# Patient Record
Sex: Male | Born: 1970
Health system: Southern US, Community
[De-identification: ages and names within clinical notes are randomized; demographics above are authoritative.]

## PROBLEM LIST (undated history)

## (undated) DIAGNOSIS — K219 Gastro-esophageal reflux disease without esophagitis: Secondary | ICD-10-CM

## (undated) DIAGNOSIS — N201 Calculus of ureter: Secondary | ICD-10-CM

## (undated) DIAGNOSIS — Z87442 Personal history of urinary calculi: Secondary | ICD-10-CM

## (undated) DIAGNOSIS — T7840XA Allergy, unspecified, initial encounter: Secondary | ICD-10-CM

## (undated) HISTORY — DX: Allergy, unspecified, initial encounter: T78.40XA

## (undated) HISTORY — DX: Personal history of urinary calculi: Z87.442

## (undated) HISTORY — DX: Gastro-esophageal reflux disease without esophagitis: K21.9

---

## 2015-01-28 ENCOUNTER — Emergency Department (HOSPITAL_COMMUNITY)
Admission: AD | Admit: 2015-01-28 | Discharge: 2015-01-28 | Disposition: A | Discharge status: Home / Self Care | Payer: 59 | Source: Ambulatory Visit | Attending: Urology | Admitting: Urology

## 2015-01-28 ENCOUNTER — Emergency Department (HOSPITAL_COMMUNITY): Payer: 59

## 2015-01-28 DIAGNOSIS — R109 Unspecified abdominal pain: Secondary | ICD-10-CM

## 2015-01-28 DIAGNOSIS — N132 Hydronephrosis with renal and ureteral calculous obstruction: Secondary | ICD-10-CM | POA: Diagnosis not present

## 2015-01-28 DIAGNOSIS — R1032 Left lower quadrant pain: Secondary | ICD-10-CM | POA: Insufficient documentation

## 2015-01-28 DIAGNOSIS — I10 Essential (primary) hypertension: Secondary | ICD-10-CM | POA: Insufficient documentation

## 2015-01-28 LAB — CBC
HEMATOCRIT: 43.9 % (ref 39.0–52.0)
HEMOGLOBIN: 14.9 g/dL (ref 13.0–17.0)
MCH: 28.8 pg (ref 26.0–34.0)
MCHC: 33.9 g/dL (ref 30.0–36.0)
MCV: 84.7 fL (ref 78.0–100.0)
Platelets: 312 10*3/uL (ref 150–400)
RBC: 5.18 MIL/uL (ref 4.22–5.81)
RDW: 12.7 % (ref 11.5–15.5)
WBC: 6.8 10*3/uL (ref 4.0–10.5)

## 2015-01-28 LAB — URINALYSIS, ROUTINE W REFLEX MICROSCOPIC
Bilirubin Urine: NEGATIVE
Glucose, UA: NEGATIVE mg/dL
Ketones, ur: 15 mg/dL — AB
Nitrite: NEGATIVE
Protein, ur: 30 mg/dL — AB
Specific Gravity, Urine: 1.022 (ref 1.005–1.030)
pH: 5.5 (ref 5.0–8.0)

## 2015-01-28 LAB — COMPREHENSIVE METABOLIC PANEL
ALBUMIN: 4.5 g/dL (ref 3.5–5.0)
ALT: 28 U/L (ref 17–63)
ANION GAP: 9 (ref 5–15)
AST: 23 U/L (ref 15–41)
Alkaline Phosphatase: 65 U/L (ref 38–126)
BUN: 14 mg/dL (ref 6–20)
CHLORIDE: 103 mmol/L (ref 101–111)
CO2: 23 mmol/L (ref 22–32)
Calcium: 9.3 mg/dL (ref 8.9–10.3)
Creatinine, Ser: 0.85 mg/dL (ref 0.61–1.24)
GFR calc Af Amer: >60 mL/min (ref >60)
Glucose, Bld: 99 mg/dL (ref 65–99)
POTASSIUM: 3.3 mmol/L — AB (ref 3.5–5.1)
Sodium: 135 mmol/L (ref 135–145)
Total Bilirubin: 1.1 mg/dL (ref 0.3–1.2)
Total Protein: 7.8 g/dL (ref 6.5–8.1)

## 2015-01-28 LAB — URINE MICROSCOPIC-ADD ON
BACTERIA UA: NONE SEEN
Squamous Epithelial / LPF: NONE SEEN

## 2015-01-28 LAB — LIPASE, BLOOD: LIPASE: 26 U/L (ref 11–51)

## 2015-01-28 MED ORDER — OXYCODONE-ACETAMINOPHEN 5-325 MG PO TABS
1.0000 | ORAL_TABLET | Freq: Once | ORAL | Status: AC
  Administered 2015-01-28: 1 via ORAL
  Filled 2015-01-28: qty 1

## 2015-01-28 MED ORDER — ONDANSETRON 4 MG PO TBDP
4.0000 mg | ORAL_TABLET | Freq: Once | ORAL | Status: AC
  Administered 2015-01-28: 4 mg via ORAL
  Filled 2015-01-28: qty 1

## 2015-01-28 MED ORDER — ONDANSETRON 4 MG PO TBDP: 4.0000 mg | ORAL_TABLET | Freq: Three times a day (TID) | ORAL | Status: DC | PRN

## 2015-01-28 MED ORDER — KETOROLAC TROMETHAMINE 30 MG/ML IJ SOLN
30.0000 mg | Freq: Once | INTRAMUSCULAR | Status: AC
  Administered 2015-01-28: 30 mg via INTRAVENOUS
  Filled 2015-01-28: qty 1

## 2015-01-28 MED ORDER — HYDROCODONE-ACETAMINOPHEN 5-325 MG PO TABS: 1.0000 | ORAL_TABLET | Freq: Four times a day (QID) | ORAL | Status: DC | PRN

## 2015-01-28 MED ORDER — SODIUM CHLORIDE 0.9 % IV BOLUS (SEPSIS)
1000.0000 mL | Freq: Once | INTRAVENOUS | Status: AC
  Administered 2015-01-28: 1000 mL via INTRAVENOUS

## 2015-01-28 NOTE — ED Notes (Signed)
Pt. C/o left flank pain, nausea. with reddish urine since 1400.

## 2015-01-28 NOTE — ED Provider Notes (Signed)
CSN: [id number]     Arrival date & time 01/28/15  1509 History   First MD Initiated Contact with Patient 01/28/15 1613     Chief Complaint  Patient presents with  . Flank Pain    HPI  Danny Zimmerman is an 44 y.o. male with no significant PMH who presents to the ED for evaluation of abdominal pain. He states he was in his usual state of health until ~1:00PM today when he suddenly had a sharp pain in his LLQ. 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 He states it radiates to his back. He took two advil and the pain resolved. He states the pain is back now though less severe and rates it 4/10. Pt is concerned because prior to coming to the ED his urine looked red. Denies dysuria, urinary frequency, urinary urgency. Denies history of kidney stones. Denies fever, chills, N/V. Denies chest pain or SOB.   No past medical history on file. No past surgical history on file. No family history on file. Social History  Substance Use Topics  . Smoking status: Not on file  . Smokeless tobacco: Not on file  . Alcohol Use: Not on file    Review of Systems  All other systems reviewed and are negative.     Allergies  Review of patient's allergies indicates not on file.  Home Medications   Prior to Admission medications   Not on File   BP 158/101 mmHg  Pulse 69  Temp(Src) 98.1 F (36.7 C) (Oral)  Resp 17  Ht 5\' 5"  (1.651 m)  Wt 170 lb (77.111 kg)  BMI 28.29 kg/m2  SpO2 97% Physical Exam  Constitutional: He is oriented to person, place, and time. No distress.  HENT:  Right Ear: External ear normal.  Left Ear: External ear normal.  Nose: Nose normal.  Mouth/Throat: Oropharynx is clear and moist. No oropharyngeal exudate.  Eyes: Conjunctivae and EOM are normal. Pupils are equal, round, and reactive to light.  Neck: Normal range of motion. Neck supple.  Cardiovascular: Normal rate, regular rhythm, normal heart sounds and intact distal  pulses.   No murmur heard. Pulmonary/Chest: Effort normal and breath sounds normal. No respiratory distress. He has no wheezes.  Abdominal: Soft. Bowel sounds are normal. He exhibits no distension. There is no tenderness. There is no rebound, no guarding and no CVA tenderness.  Musculoskeletal: Normal range of motion. He exhibits no edema.  Lymphadenopathy:    He has no cervical adenopathy.  Neurological: He is alert and oriented to person, place, and time. No cranial nerve deficit.  Skin: Skin is warm and dry. He is not diaphoretic.  Psychiatric: He has a normal mood and affect.  Nursing note and vitals reviewed.   ED Course  Procedures (including critical care time) Labs Review Labs Reviewed  URINALYSIS, ROUTINE W REFLEX MICROSCOPIC (NOT AT [facility]) - Abnormal; Notable for the following:    Color, Urine RED (*)    APPearance CLOUDY (*)    Hgb urine dipstick LARGE (*)    Ketones, ur 15 (*)    Protein, ur 30 (*)    Leukocytes, UA SMALL (*)    All other components within normal limits  COMPREHENSIVE METABOLIC PANEL - Abnormal; Notable for the following:    Potassium 3.3 (*)    All other components within normal limits  LIPASE, BLOOD  CBC  URINE MICROSCOPIC-ADD ON    Imaging Review US  Renal  01/28/2015  CLINICAL DATA:  Patient with left flank pain. EXAM: RENAL / URINARY TRACT ULTRASOUND COMPLETE COMPARISON:  None. FINDINGS: Right Kidney: Length: 11.9 cm. Normal renal cortical thickness and echogenicity. No hydronephrosis. Off of the inferior pole there is a 1.8 x 1.3 x 1.4 cm hypoechoic mass. Left Kidney: Length: 12.0 cm. Normal renal cortical thickness and echogenicity. Minimal pelviectasis. Bladder: Appears normal for degree of bladder distention. IMPRESSION: Minimal left-sided pelviectasis. Indeterminate hypoechoic exophytic mass off the inferior pole of the right kidney. This needs dedicated evaluation with either pre and post contrast-enhanced CT or MRI in the non acute setting.  Electronically Signed   By: Lovey Newcomer M.D.   On: 01/28/2015 18:15   I have personally reviewed and evaluated these images and lab results as part of my medical decision-making.   EKG Interpretation None      MDM   Final diagnoses:  Left flank pain    Nonfocal exam. Pt mildly hypertensive but otherwise vital signs unremarkable. Given gross hematuria am concerned about possible kidney stone. UA, lipase, cbc, cmp ordered. Will get renal US.  UA confirms large hgb, 15 ketones, 30 protein, small leuks. US renal shows minimal L side pelviectasis which might be causing pt's pain. Discussed findings of mass on R kidney that will require f/u. Pt and wife verbalized understanding. D/c home with pain meds and zofran with instructions to schedule urology f/u.     Danny Ng, PA-C 01/29/15 0101  Danny Arthurs, MD 01/29/15 [phone]

## 2015-01-28 NOTE — Progress Notes (Signed)
Patient listed as having [place] insurance without a pcp.  EDCM spoke to patient at bedside.  Patient reports he is new to this state and does not have a pcp yet.  [facility] provided patient with list of pcps who accept [employer] insurnace within a ten mile radius of patient's zip code.  Patient thankful for resources.  No further EDCM needs at this time.

## 2015-01-28 NOTE — Discharge Instructions (Signed)
As we discussed, your bloodwork today was normal. You can take the Vicodin (pain medicine) and Zofran (nausea medicine) as needed.  Please follow-up with urology as soon as possible for further evaluation of your ultrasound findings and the blood & protein in your urine. If the pain in your left side does not resolve with urologic treatment please follow-up with your primary care provider. Please also talk to your primary care provider about your blood pressure. It was slightly high in the ER today. You might require blood pressure medicine if it does not go back down to normal. Return to the ER for new or worsening symptoms.   Please obtain all of your results from medical records or have your doctors office obtain the results - share them with your doctor - you should be seen at your doctors office in the next 2 days. Call today to arrange your follow up. Take the medications as prescribed. Please review all of the medicines and only take them if you do not have an allergy to them. Please be aware that if you are taking birth control pills, taking other prescriptions, ESPECIALLY ANTIBIOTICS may make the birth control ineffective - if this is the case, either do not engage in sexual activity or use alternative methods of birth control such as condoms until you have finished the medicine and your family doctor says it is OK to restart them. If you are on a blood thinner such as COUMADIN, be aware that any other medicine that you take may cause the coumadin to either work too much, or not enough - you should have your coumadin level rechecked in next 7 days if this is the case.  ?  It is also a possibility that you have an allergic reaction to any of the medicines that you have been prescribed - Everybody reacts differently to medications and while MOST people have no trouble with most medicines, you may have a reaction such as nausea, vomiting, rash, swelling, shortness of breath. If this is the case, please  stop taking the medicine immediately and contact your physician.  ?  You should return to the ER if you develop severe or worsening symptoms.

## 2015-01-30 ENCOUNTER — Encounter (HOSPITAL_COMMUNITY): Payer: Self-pay | Admitting: Emergency Medicine

## 2015-01-30 ENCOUNTER — Emergency Department (HOSPITAL_COMMUNITY)
Admission: EM | Admit: 2015-01-30 | Discharge: 2015-01-30 | Disposition: A | Discharge status: Home / Self Care | Payer: 59 | Source: Home / Self Care | Attending: Emergency Medicine | Admitting: Emergency Medicine

## 2015-01-30 ENCOUNTER — Emergency Department (HOSPITAL_COMMUNITY): Payer: 59

## 2015-01-30 DIAGNOSIS — N200 Calculus of kidney: Secondary | ICD-10-CM

## 2015-01-30 LAB — BASIC METABOLIC PANEL
Anion gap: 9 (ref 5–15)
BUN: 9 mg/dL (ref 6–20)
CHLORIDE: 103 mmol/L (ref 101–111)
CO2: 23 mmol/L (ref 22–32)
Calcium: 9.2 mg/dL (ref 8.9–10.3)
Creatinine, Ser: 1.44 mg/dL — ABNORMAL HIGH (ref 0.61–1.24)
GFR calc Af Amer: >60 mL/min (ref >60)
GFR calc non Af Amer: 59 mL/min — ABNORMAL LOW (ref >60)
GLUCOSE: 110 mg/dL — AB (ref 65–99)
POTASSIUM: 3.8 mmol/L (ref 3.5–5.1)
Sodium: 135 mmol/L (ref 135–145)

## 2015-01-30 LAB — CBC WITH DIFFERENTIAL/PLATELET
Basophils Absolute: 0 10*3/uL (ref 0.0–0.1)
Basophils Relative: 0 %
EOS PCT: 0 %
Eosinophils Absolute: 0 10*3/uL (ref 0.0–0.7)
HCT: 43.6 % (ref 39.0–52.0)
Hemoglobin: 14.9 g/dL (ref 13.0–17.0)
LYMPHS ABS: 1.5 10*3/uL (ref 0.7–4.0)
LYMPHS PCT: 12 %
MCH: 29.1 pg (ref 26.0–34.0)
MCHC: 34.2 g/dL (ref 30.0–36.0)
MCV: 85.2 fL (ref 78.0–100.0)
MONO ABS: 1.1 10*3/uL — AB (ref 0.1–1.0)
MONOS PCT: 9 %
Neutro Abs: 9.7 10*3/uL — ABNORMAL HIGH (ref 1.7–7.7)
Neutrophils Relative %: 79 %
PLATELETS: 265 10*3/uL (ref 150–400)
RBC: 5.12 MIL/uL (ref 4.22–5.81)
RDW: 12.8 % (ref 11.5–15.5)
WBC: 12.4 10*3/uL — ABNORMAL HIGH (ref 4.0–10.5)

## 2015-01-30 LAB — URINE MICROSCOPIC-ADD ON: BACTERIA UA: NONE SEEN

## 2015-01-30 LAB — URINALYSIS, ROUTINE W REFLEX MICROSCOPIC
Bilirubin Urine: NEGATIVE
GLUCOSE, UA: NEGATIVE mg/dL
Ketones, ur: NEGATIVE mg/dL
Leukocytes, UA: NEGATIVE
Nitrite: NEGATIVE
PROTEIN: NEGATIVE mg/dL
Specific Gravity, Urine: 1.017 (ref 1.005–1.030)
pH: 5.5 (ref 5.0–8.0)

## 2015-01-30 MED ORDER — TAMSULOSIN HCL 0.4 MG PO CAPS: 0.4000 mg | ORAL_CAPSULE | Freq: Every day | ORAL | Status: DC

## 2015-01-30 MED ORDER — SODIUM CHLORIDE 0.9 % IV SOLN
1000.0000 mL | Freq: Once | INTRAVENOUS | Status: AC
  Administered 2015-01-30: 1000 mL via INTRAVENOUS

## 2015-01-30 MED ORDER — HYDROMORPHONE HCL 1 MG/ML IJ SOLN
1.0000 mg | Freq: Once | INTRAMUSCULAR | Status: AC
  Administered 2015-01-30: 1 mg via INTRAVENOUS
  Filled 2015-01-30: qty 1

## 2015-01-30 MED ORDER — SODIUM CHLORIDE 0.9 % IV SOLN: 1000.0000 mL | INTRAVENOUS | Status: DC

## 2015-01-30 MED ORDER — ONDANSETRON HCL 4 MG/2ML IJ SOLN
4.0000 mg | Freq: Once | INTRAMUSCULAR | Status: AC
  Administered 2015-01-30: 4 mg via INTRAVENOUS
  Filled 2015-01-30: qty 2

## 2015-01-30 MED ORDER — OXYCODONE-ACETAMINOPHEN 5-325 MG PO TABS: 1.0000 | ORAL_TABLET | ORAL | Status: DC | PRN

## 2015-01-30 MED ORDER — ONDANSETRON 4 MG PO TBDP: ORAL_TABLET | ORAL | Status: DC

## 2015-01-30 MED ORDER — IBUPROFEN 400 MG PO TABS: 400.0000 mg | ORAL_TABLET | Freq: Four times a day (QID) | ORAL | Status: DC | PRN

## 2015-01-30 MED ORDER — KETOROLAC TROMETHAMINE 30 MG/ML IJ SOLN
30.0000 mg | Freq: Once | INTRAMUSCULAR | Status: AC
  Administered 2015-01-30: 30 mg via INTRAVENOUS
  Filled 2015-01-30: qty 1

## 2015-01-30 MED ORDER — SODIUM CHLORIDE 0.9 % IV SOLN: 1000.0000 mL | Freq: Once | INTRAVENOUS | Status: DC

## 2015-01-30 NOTE — ED Provider Notes (Signed)
CSN: [id number]     Arrival date & time 01/30/15  0720 History   First MD Initiated Contact with Patient 01/30/15 [phone]     Chief Complaint  Patient presents with  . Flank Pain  . Back Pain     (Consider location/radiation/quality/duration/timing/severity/associated sxs/prior Treatment) HPI Patient seen 2 days ago and diagnosed with flank pain and hematuria. At that time ultrasound showed some left hydronephrosis. Patient was discharged with hydrocodone and Zofran. He reports the pain has worsened and he has not been able to sleep for 2 nights. He states that he has taken the pain medication and Zofran but he continues to have nausea and vomiting. He reports some chills but no documented fever. Pain is sharp and severe on the left flank radiating to the left lower abdomen. He denies testicular pain or pain radiating to the legs. He denies any past medical history. Pain is not particularly worsened by any movement or pressure on the abdomen. History reviewed. No pertinent past medical history. History reviewed. No pertinent past surgical history. No family history on file. Social History  Substance Use Topics  . Smoking status: Never Smoker   . Smokeless tobacco: None  . Alcohol Use: Yes     Comment: socially    Review of Systems 10 Systems reviewed and are negative for acute change except as noted in the HPI.    Allergies  Review of patient's allergies indicates no known allergies.  Home Medications   Prior to Admission medications   Medication Sig Start Date End Date Taking? Authorizing Provider  HYDROcodone-acetaminophen (NORCO/VICODIN) 5-325 MG tablet Take 1 tablet by mouth every 6 (six) hours as needed. 01/28/15  Yes Olivia Canter Sam, PA-C  ibuprofen (ADVIL,MOTRIN) 200 MG tablet Take 400 mg by mouth every 6 (six) hours as needed for moderate pain.   Yes Historical Provider, MD  ondansetron (ZOFRAN ODT) 4 MG disintegrating tablet Take 1 tablet (4 mg total) by mouth every 8  (eight) hours as needed for nausea or vomiting. 01/28/15  Yes Olivia Canter Sam, PA-C  ibuprofen (ADVIL,MOTRIN) 400 MG tablet Take 1 tablet (400 mg total) by mouth every 6 (six) hours as needed. 01/30/15   Charlesetta Shanks, MD  ondansetron (ZOFRAN ODT) 4 MG disintegrating tablet 4mg  ODT q4 hours prn nausea/vomit 01/30/15   Charlesetta Shanks, MD  oxyCODONE-acetaminophen (PERCOCET/ROXICET) 5-325 MG tablet Take 1-2 tablets by mouth every 4 (four) hours as needed for severe pain. 01/30/15   Charlesetta Shanks, MD   BP 113/79 mmHg  Pulse 73  Temp(Src) 98.2 F (36.8 C) (Oral)  Resp 16  Ht 5\' 5"  (1.651 m)  Wt 170 lb (77.111 kg)  BMI 28.29 kg/m2  SpO2 98% Physical Exam  Constitutional: He is oriented to person, place, and time. He appears well-developed and well-nourished.  Patient appears uncomfortable but is alert and nontoxic.  HENT:  Head: Normocephalic and atraumatic.  Eyes: EOM are normal. Pupils are equal, round, and reactive to light.  Neck: Neck supple.  Cardiovascular: Normal rate, regular rhythm, normal heart sounds and intact distal pulses.   Pulmonary/Chest: Effort normal and breath sounds normal.  Abdominal: Soft. Bowel sounds are normal. He exhibits no distension. There is no tenderness.  Patient reports the pain is not particularly reproducible to any palpation in the left lower abdomen or flank.  Musculoskeletal: Normal range of motion. He exhibits no edema.  Neurological: He is alert and oriented to person, place, and time. He has normal strength. Coordination normal. GCS eye subscore is 4.  GCS verbal subscore is 5. GCS motor subscore is 6.  Skin: Skin is warm, dry and intact.  Psychiatric: He has a normal mood and affect.    ED Course  Procedures (including critical care time) Labs Review Labs Reviewed  BASIC METABOLIC PANEL - Abnormal; Notable for the following:    Glucose, Bld 110 (*)    Creatinine, Ser 1.44 (*)    GFR calc non Af Amer 59 (*)    All other components within  normal limits  CBC WITH DIFFERENTIAL/PLATELET - Abnormal; Notable for the following:    WBC 12.4 (*)    Neutro Abs 9.7 (*)    Monocytes Absolute 1.1 (*)    All other components within normal limits  URINALYSIS, ROUTINE W REFLEX MICROSCOPIC (NOT AT [facility]) - Abnormal; Notable for the following:    APPearance TURBID (*)    Hgb urine dipstick SMALL (*)    All other components within normal limits  URINE MICROSCOPIC-ADD ON - Abnormal; Notable for the following:    Squamous Epithelial / LPF 0-5 (*)    All other components within normal limits    Imaging Review US Renal  01/28/2015  CLINICAL DATA:  Patient with left flank pain. EXAM: RENAL / URINARY TRACT ULTRASOUND COMPLETE COMPARISON:  None. FINDINGS: Right Kidney: Length: 11.9 cm. Normal renal cortical thickness and echogenicity. No hydronephrosis. Off of the inferior pole there is a 1.8 x 1.3 x 1.4 cm hypoechoic mass. Left Kidney: Length: 12.0 cm. Normal renal cortical thickness and echogenicity. Minimal pelviectasis. Bladder: Appears normal for degree of bladder distention. IMPRESSION: Minimal left-sided pelviectasis. Indeterminate hypoechoic exophytic mass off the inferior pole of the right kidney. This needs dedicated evaluation with either pre and post contrast-enhanced CT or MRI in the non acute setting. Electronically Signed   By: Lovey Newcomer M.D.   On: 01/28/2015 18:15   Ct Renal Stone Study  01/30/2015  CLINICAL DATA:  Right flank pain.  Nausea and vomiting. EXAM: CT ABDOMEN AND PELVIS WITHOUT CONTRAST TECHNIQUE: Multidetector CT imaging of the abdomen and pelvis was performed following the standard protocol without IV contrast. COMPARISON:  Ultrasound dated 01/28/2015 FINDINGS: Lower chest:  Minimal linear atelectasis at the lung bases. Hepatobiliary: Normal. Pancreas: Normal. Spleen: Normal. Adrenals/Urinary Tract: 4 mm stone obstructing the left ureter at the ureterovesical junction with slight left hydronephrosis. Tiny stone in the  lower pole of the left kidney. 4 mm stone in the lower pole of the right kidney and 6 mm stone in the upper pole. 13 mm cyst in the lower pole of the right kidney. Bladder is normal. Stomach/Bowel: Normal. Vascular/Lymphatic: Normal. Reproductive: Normal. Other: No free air or free fluid. Musculoskeletal: Normal. IMPRESSION: 4 mm stone obstructing the distal left ureter. Slight left hydronephrosis. Bilateral renal calculi. Electronically Signed   By: Lorriane Shire M.D.   On: 01/30/2015 09:25   I have personally reviewed and evaluated these images and lab results as part of my medical decision-making.   EKG Interpretation None      MDM   Final diagnoses:  Kidney stone   Patient presents with flank pain. He does have a kidney stone at the [facility]. He has been completely pain controlled in the emergency department. At this time I will change him to Percocet and ibuprofen for pain control. He is instructed to strain urine and follow up with urology. Patient instructed to return if he should have uncontrolled pain or other concerning symptoms.    Charlesetta Shanks, MD 01/30/15 [phone]

## 2015-01-30 NOTE — ED Notes (Signed)
Pt c./o left flank pain and left back pain ongoing since Friday. Pt seen at [facility] and was told he has a kidney stone. Prescribed medications not working for pain or N/V.

## 2015-01-30 NOTE — ED Notes (Signed)
PT IS IN STABLE CONDITION UPON D/C AND AMBULATES FROM ED.

## 2015-01-30 NOTE — Discharge Instructions (Signed)
Kidney Stones °Kidney stones (urolithiasis) are deposits that form inside your kidneys. The intense pain is caused by the stone moving through the urinary tract. When the stone moves, the ureter goes into spasm around the stone. The stone is usually passed in the urine.  °CAUSES  °· A disorder that makes certain neck glands produce too much parathyroid hormone (primary hyperparathyroidism). °· A buildup of uric acid crystals, similar to gout in your joints. °· Narrowing (stricture) of the ureter. °· A kidney obstruction present at birth (congenital obstruction). °· Previous surgery on the kidney or ureters. °· Numerous kidney infections. °SYMPTOMS  °· Feeling sick to your stomach (nauseous). °· Throwing up (vomiting). °· Blood in the urine (hematuria). °· Pain that usually spreads (radiates) to the groin. °· Frequency or urgency of urination. °DIAGNOSIS  °· Taking a history and physical exam. °· Blood or urine tests. °· CT scan. °· Occasionally, an examination of the inside of the urinary bladder (cystoscopy) is performed. °TREATMENT  °· Observation. °· Increasing your fluid intake. °· Extracorporeal shock wave lithotripsy--This is a noninvasive procedure that uses shock waves to break up kidney stones. °· Surgery may be needed if you have severe pain or persistent obstruction. There are various surgical procedures. Most of the procedures are performed with the use of small instruments. Only small incisions are needed to accommodate these instruments, so recovery time is minimized. °The size, location, and chemical composition are all important variables that will determine the proper choice of action for you. Talk to your health care provider to better understand your situation so that you will minimize the risk of injury to yourself and your kidney.  °HOME CARE INSTRUCTIONS  °· Drink enough water and fluids to keep your urine clear or pale yellow. This will help you to pass the stone or stone fragments. °· Strain  all urine through the provided strainer. Keep all particulate matter and stones for your health care provider to see. The stone causing the pain may be as small as a grain of salt. It is very important to use the strainer each and every time you pass your urine. The collection of your stone will allow your health care provider to analyze it and verify that a stone has actually passed. The stone analysis will often identify what you can do to reduce the incidence of recurrences. °· Only take over-the-counter or prescription medicines for pain, discomfort, or fever as directed by your health care provider. °· Keep all follow-up visits as told by your health care provider. This is important. °· Get follow-up X-rays if required. The absence of pain does not always mean that the stone has passed. It may have only stopped moving. If the urine remains completely obstructed, it can cause loss of kidney function or even complete destruction of the kidney. It is your responsibility to make sure X-rays and follow-ups are completed. Ultrasounds of the kidney can show blockages and the status of the kidney. Ultrasounds are not associated with any radiation and can be performed easily in a matter of minutes. °· Make changes to your daily diet as told by your health care provider. You may be told to: °¨ Limit the amount of salt that you eat. °¨ Eat 5 or more servings of fruits and vegetables each day. °¨ Limit the amount of meat, poultry, fish, and eggs that you eat. °· Collect a 24-hour urine sample as told by your health care provider. You may need to collect another urine sample every 6-12   months. °SEEK MEDICAL CARE IF: °· You experience pain that is progressive and unresponsive to any pain medicine you have been prescribed. °SEEK IMMEDIATE MEDICAL CARE IF:  °· Pain cannot be controlled with the prescribed medicine. °· You have a fever or shaking chills. °· The severity or intensity of pain increases over 18 hours and is not  relieved by pain medicine. °· You develop a new onset of abdominal pain. °· You feel faint or pass out. °· You are unable to urinate. °  °This information is not intended to replace advice given to you by your health care provider. Make sure you discuss any questions you have with your health care provider. °  °Document Released: 02/26/2005 Document Revised: 11/17/2014 Document Reviewed: 07/30/2012 °Elsevier Interactive Patient Education ©2016 [employer] ° °

## 2015-02-01 ENCOUNTER — Inpatient Hospital Stay (HOSPITAL_COMMUNITY): Payer: 59 | Admitting: Certified Registered"

## 2015-02-01 ENCOUNTER — Encounter (HOSPITAL_COMMUNITY)
Admission: AD | Disposition: A | Discharge status: Home / Self Care | Payer: Self-pay | Source: Ambulatory Visit | Attending: Urology

## 2015-02-01 ENCOUNTER — Other Ambulatory Visit: Payer: Self-pay | Admitting: Urology

## 2015-02-01 ENCOUNTER — Ambulatory Visit (HOSPITAL_COMMUNITY)
Admission: AD | Admit: 2015-02-01 | Discharge: 2015-02-01 | Disposition: A | Discharge status: Home / Self Care | Payer: 59 | Source: Ambulatory Visit | Attending: Urology | Admitting: Urology

## 2015-02-01 ENCOUNTER — Encounter (HOSPITAL_COMMUNITY): Payer: Self-pay | Admitting: *Deleted

## 2015-02-01 DIAGNOSIS — N132 Hydronephrosis with renal and ureteral calculous obstruction: Secondary | ICD-10-CM | POA: Insufficient documentation

## 2015-02-01 HISTORY — DX: Calculus of ureter: N20.1

## 2015-02-01 HISTORY — PX: CYSTOSCOPY/RETROGRADE/URETEROSCOPY/STONE EXTRACTION WITH BASKET: SHX5317

## 2015-02-01 SURGERY — CYSTOSCOPY, WITH CALCULUS REMOVAL USING BASKET
Anesthesia: General | Laterality: Left

## 2015-02-01 MED ORDER — ONDANSETRON HCL 4 MG/2ML IJ SOLN: 4.0000 mg | Freq: Once | INTRAMUSCULAR | Status: DC | PRN

## 2015-02-01 MED ORDER — IOHEXOL 300 MG/ML  SOLN
INTRAMUSCULAR | Status: DC | PRN
  Administered 2015-02-01: 50 mL via INTRAVENOUS

## 2015-02-01 MED ORDER — ONDANSETRON HCL 4 MG/2ML IJ SOLN
INTRAMUSCULAR | Status: DC | PRN
  Administered 2015-02-01: 4 mg via INTRAVENOUS

## 2015-02-01 MED ORDER — HYDROMORPHONE HCL 1 MG/ML IJ SOLN: 0.2500 mg | INTRAMUSCULAR | Status: DC | PRN

## 2015-02-01 MED ORDER — BELLADONNA ALKALOIDS-OPIUM 16.2-60 MG RE SUPP
RECTAL | Status: AC
  Filled 2015-02-01: qty 1

## 2015-02-01 MED ORDER — FENTANYL CITRATE (PF) 100 MCG/2ML IJ SOLN
INTRAMUSCULAR | Status: AC
  Filled 2015-02-01: qty 2

## 2015-02-01 MED ORDER — HYDROCODONE-ACETAMINOPHEN 5-325 MG PO TABS: 1.0000 | ORAL_TABLET | Freq: Four times a day (QID) | ORAL | Status: DC | PRN

## 2015-02-01 MED ORDER — CEFAZOLIN SODIUM-DEXTROSE 2-3 GM-% IV SOLR
2.0000 g | Freq: Once | INTRAVENOUS | Status: AC
  Administered 2015-02-01: 2 g via INTRAVENOUS

## 2015-02-01 MED ORDER — CIPROFLOXACIN HCL 500 MG PO TABS: 500.0000 mg | ORAL_TABLET | Freq: Two times a day (BID) | ORAL | Status: DC

## 2015-02-01 MED ORDER — DEXAMETHASONE SODIUM PHOSPHATE 10 MG/ML IJ SOLN
INTRAMUSCULAR | Status: DC | PRN
  Administered 2015-02-01: 10 mg via INTRAVENOUS

## 2015-02-01 MED ORDER — ONDANSETRON HCL 4 MG/2ML IJ SOLN
INTRAMUSCULAR | Status: AC
Start: 2015-02-01 — End: 2015-02-01
  Filled 2015-02-01: qty 2

## 2015-02-01 MED ORDER — MEPERIDINE HCL 50 MG/ML IJ SOLN: 6.2500 mg | INTRAMUSCULAR | Status: DC | PRN

## 2015-02-01 MED ORDER — SODIUM CHLORIDE 0.9 % IR SOLN
Status: DC | PRN
  Administered 2015-02-01: 3000 mL

## 2015-02-01 MED ORDER — FENTANYL CITRATE (PF) 100 MCG/2ML IJ SOLN
50.0000 ug | INTRAMUSCULAR | Status: DC | PRN
  Administered 2015-02-01 (×2): 50 ug via INTRAVENOUS

## 2015-02-01 MED ORDER — FENTANYL CITRATE (PF) 100 MCG/2ML IJ SOLN
INTRAMUSCULAR | Status: DC | PRN
  Administered 2015-02-01 (×2): 50 ug via INTRAVENOUS

## 2015-02-01 MED ORDER — LACTATED RINGERS IV SOLN
INTRAVENOUS | Status: DC
  Administered 2015-02-01: 1000 mL via INTRAVENOUS

## 2015-02-01 MED ORDER — SUCCINYLCHOLINE CHLORIDE 20 MG/ML IJ SOLN
INTRAMUSCULAR | Status: DC | PRN
  Administered 2015-02-01: 100 mg via INTRAVENOUS

## 2015-02-01 MED ORDER — MIDAZOLAM HCL 2 MG/2ML IJ SOLN
INTRAMUSCULAR | Status: AC
  Filled 2015-02-01: qty 2

## 2015-02-01 MED ORDER — PROPOFOL 10 MG/ML IV BOLUS
INTRAVENOUS | Status: DC | PRN
  Administered 2015-02-01: 200 mg via INTRAVENOUS

## 2015-02-01 MED ORDER — MIDAZOLAM HCL 5 MG/5ML IJ SOLN
INTRAMUSCULAR | Status: DC | PRN
  Administered 2015-02-01: 2 mg via INTRAVENOUS

## 2015-02-01 MED ORDER — LIDOCAINE HCL (PF) 2 % IJ SOLN
INTRAMUSCULAR | Status: DC | PRN
  Administered 2015-02-01: 30 mg via INTRADERMAL

## 2015-02-01 MED ORDER — PROPOFOL 10 MG/ML IV BOLUS
INTRAVENOUS | Status: AC
  Filled 2015-02-01: qty 20

## 2015-02-01 MED ORDER — SODIUM CHLORIDE 0.9 % IR SOLN
Status: DC | PRN
  Administered 2015-02-01: 1000 mL

## 2015-02-01 MED ORDER — CEFAZOLIN SODIUM-DEXTROSE 2-3 GM-% IV SOLR
INTRAVENOUS | Status: AC
  Filled 2015-02-01: qty 50

## 2015-02-01 MED ORDER — BELLADONNA ALKALOIDS-OPIUM 16.2-60 MG RE SUPP
RECTAL | Status: DC | PRN
  Administered 2015-02-01: 1 via RECTAL

## 2015-02-01 MED ORDER — DEXAMETHASONE SODIUM PHOSPHATE 10 MG/ML IJ SOLN
INTRAMUSCULAR | Status: AC
  Filled 2015-02-01: qty 1

## 2015-02-01 MED ORDER — LIDOCAINE HCL (CARDIAC) 20 MG/ML IV SOLN
INTRAVENOUS | Status: AC
  Filled 2015-02-01: qty 5

## 2015-02-01 SURGICAL SUPPLY — 21 items
BAG URO CATCHER STRL LF (DRAPE) ×2 IMPLANT
BASKET STNLS GEMINI 4WIRE 3FR (BASKET) IMPLANT
BASKET ZERO TIP NITINOL 2.4FR (BASKET) ×2 IMPLANT
CATH INTERMIT  6FR 70CM (CATHETERS) ×2 IMPLANT
CLOTH BEACON ORANGE TIMEOUT ST (SAFETY) ×2 IMPLANT
FIBER LASER FLEXIVA 1000 (UROLOGICAL SUPPLIES) IMPLANT
FIBER LASER FLEXIVA 200 (UROLOGICAL SUPPLIES) IMPLANT
FIBER LASER FLEXIVA 365 (UROLOGICAL SUPPLIES) ×2 IMPLANT
FIBER LASER FLEXIVA 550 (UROLOGICAL SUPPLIES) IMPLANT
FIBER LASER TRAC TIP (UROLOGICAL SUPPLIES) ×2 IMPLANT
GLOVE BIOGEL M STRL SZ7.5 (GLOVE) ×2 IMPLANT
GOWN STRL REUS W/TWL XL LVL3 (GOWN DISPOSABLE) ×2 IMPLANT
GUIDEWIRE STR DUAL SENSOR (WIRE) ×2 IMPLANT
IV NS 1000ML (IV SOLUTION) ×1
IV NS 1000ML BAXH (IV SOLUTION) ×1 IMPLANT
KIT BALLN UROMAX 15FX4 (MISCELLANEOUS) ×1 IMPLANT
KIT BALLN UROMAX 26 75X4 (MISCELLANEOUS) ×1
MANIFOLD NEPTUNE II (INSTRUMENTS) ×2 IMPLANT
PACK CYSTO (CUSTOM PROCEDURE TRAY) ×2 IMPLANT
STENT URET 6FRX24 CONTOUR (STENTS) ×2 IMPLANT
TUBING CONNECTING 10 (TUBING) ×2 IMPLANT

## 2015-02-01 NOTE — Anesthesia Postprocedure Evaluation (Signed)
Anesthesia Post Note  Patient: [facility]  Procedure(s) Performed: Procedure(s) (LRB): CYSTOSCOPY/LEFT RETROGRADE/LEFT URETERAL BALLON DILITATION/LEFT /URETEROSCOPY/STONE EXTRACTION WITH BASKET LEFT URETERAL STENT (Left)  Patient location during evaluation: PACU Anesthesia Type: General Level of consciousness: awake and alert Pain management: pain level controlled Vital Signs Assessment: post-procedure vital signs reviewed and stable Respiratory status: spontaneous breathing, nonlabored ventilation, respiratory function stable and patient connected to nasal cannula oxygen Cardiovascular status: blood pressure returned to baseline and stable Postop Assessment: No signs of nausea or vomiting Anesthetic complications: no    Last Vitals:  Filed Vitals:   02/01/15 1845 02/01/15 1915  BP: 134/86 138/101  Pulse: 73 66  Temp: 36.9 C   Resp: 17 19    Last Pain:  Filed Vitals:   02/01/15 1918  PainSc: 0-No pain                 Nyazia Canevari DAVID

## 2015-02-01 NOTE — Transfer of Care (Signed)
Immediate Anesthesia Transfer of Care Note  Patient: [facility]  Procedure(s) Performed: Procedure(s): CYSTOSCOPY/LEFT RETROGRADE/LEFT URETERAL BALLON DILITATION/LEFT /URETEROSCOPY/STONE EXTRACTION WITH BASKET LEFT URETERAL STENT (Left)  Patient Location: PACU  Anesthesia Type:General  Level of Consciousness:  sedated, patient cooperative and responds to stimulation  Airway & Oxygen Therapy:Patient Spontanous Breathing and Patient connected to face mask oxgen  Post-op Assessment:  Report given to PACU RN and Post -op Vital signs reviewed and stable  Post vital signs:  Reviewed and stable  Last Vitals:  Filed Vitals:   02/01/15 1500 02/01/15 1830  BP: 121/74   Pulse: 77   Temp: 36.6 C 36.9 C  Resp: 18     Complications: No apparent anesthesia complications

## 2015-02-01 NOTE — Discharge Instructions (Signed)
You have a ureteral stent in place. There is a small string attached to this that is coming out of the tube that you urinate out of. You will pull the string with the stent out of your urethra in 3 days. Do not pull out the string before 3 days. Do this first thing in the morning. It should not hurt badly but may feel strange. You may see blood in your urine afterwards and that is normal as long as it doesn't look thick like ketchup, have large blood clots or prevent you from urinating.  Call [facility] tomorrow morning and schedule a follow up appointment with one of the Nurse Practitioners in 2 weeks and follow up with Dr. Gaynelle Arabian in 6 weeks.  [facility] at the numbers below for fever >101.39F by mouth, uncontrolled nausea or vomiting, pain uncontrolled by medication, decreased urine output, signs of wound infection (rapidly spreading redness/swelling, purulent discharge, increasing bleeding from wounds, or separation of wound); also call for any new or concerning symptoms.  You may shower and bath as normal  Do not drive while taking narcotic pain medications. Take Colace and/or Senna while taking narcotic pain medication. You may take over-the-counter Laxatives such as Miralax, Senna, or Milk of Magnesia. Stop taking these medications if you develop diarrhea.  Take all medications as prescribed.  Ureteral Stent Instructions: You have been discharged home with a ureteral stent in place. This is a temporary tube placed in the tube (aka ureter) that drains from the kidney to the bladder. This stent is TEMPORARY and it must be removed or exchanged within three months or else significant damage can occur.  Stents cause most patients to have side effects which may include urinary urgency, frequency, and/or pain with voiding. In a male, they can cause erection problems. They may also cause discomfort with strenuous activity. You will likely receive several medicines to help with  these symptoms, for example, oxybutynin (ditropan) or tamsulosin (flomax). Oxybutynin helps quiet the bladder, but it may cause constipation or dry mouth. Please use over-the-counter laxatives or stool softeners to help with the former, and sucking on a sugar-free lozenge can help with dry mouth. Tamsulosin is usually taken at night before going to bed, and it helps with stent discomfort. You may also be prescribed a urinary analgesic (e.g. pyridium, prosed, uribel) to help with pain with urination that can change the color of your urine. Please do not be alarmed by this color change.  You may see some blood in your urine, but this is usually normal. If you have persistent bleeding that is not improving and/or large amounts of clots in your urine and the inability to urinate, please contact Urology to discuss your symptoms.

## 2015-02-01 NOTE — Op Note (Signed)
Preoperative Diagnosis:         left ureteral stone   Postoperative Diagnosis:         left ureteral stone   Procedure(s) Performed:         1. Cystourethroscopy        2. Left ureteroscopy with laser lithotripsy and basket stone extraction       3. Left retrograde pyelograms       4. Left ureteral stent placement       5. Left ureteral balloon dilation       6. Intraoperative fluoroscopy with interpretation less than 1 hour   Teaching Surgeon:   Carolan Clines, M.D.     Resident Surgeon:    E. Harvel Ricks, M.D.   Assistants:         None.   Anesthesia:         General via endotracheal tube.     Specimens:          Renal stone for chemical analysis.     Drains:             93F x 24 double-J ureteral stent with string   Cultures:           None   Estimated Blood Loss: Minimal   IV Fluids:          See Anesthesia record   Complications:              None.   Indications for Surgery:    Danny Zimmerman  is a 44 year old male with a history of nephrolithiasis.  The patient was evaluated and noted with a 4 mm left ureteral stone.  The patient presents today for ureteroscopic treatment of left ureteral stone. The risks and benefits of the procedure were discussed with the patient who wishes to proceed.   Operative Findings:         Normal cystourethroscopy.   All significantly sized stones successfully removed and sent for analysis.  Successful placement of ureteral stent.  Radiologic interpretation of Retrograde pyelogram:  Left kidney demonstrated filling defect in the distal ureter and mild hydroureteronephrosis.  Procedure:          The patient was correctly identified in the preoperative holding area where written informed consent as well potential risks and complications were reviewed. The patient was brought back to the operative suite where a preinduction timeout was performed. After correct information was verified, general anesthesia was induced via endotracheal  tube.  The patient was then gently placed in dorsal lithotomy position.  Sequential compression devices were placed for VTE prophylaxis. The patient was prepped and draped in the usual sterile fashion and appropriate periprocedural antibiotics were administered. A second timeout was performed.   We began by inserting a 22 French rigid cystoscope per urethra with copious lubrication and normal saline irrigation running. The bladder and urethra appeared grossly normal.  We proceeded to cannulate the left ureteral orifice with a combination of a sensor wire and open-ended catheter and performed a retrograde pyelogram after wire removal.  This demonstrated a small filling defect consistent with the known left ureteral stone and mild hydroureteronephrosis.  We then replaced our wire into the renal pelvis under fluoroscopic guidance. We then obtained a semi-rigid ureteroscope and introduced this into the bladder, however, the ureteral orifice was too tight to accommodate the ureteroscope. We then obtained a 4 cm balloon dilator and dilated the ureteral orifice. We then removed the balloon dilator and again passed the semi-rigid  ureteroscope alongside the wire into the distal ureter where we encountered the stone with significant edema and some evidence of impaction.   Using a combination of laser lithotripsy and basket extraction, all visualized stones were removed and the patient was felt to be stone-free at the end of the procedure.  We emptied the patient's bladder, leaving our sensor wire in place.   At this point, we proceeded with ureteral stent placement.  We initially placed a 6 x 24 double-J ureteral stent with the assistance of a pusher over our sensor wire with a satisfactory stent curl proximally and distally within the bladder. We then refilled and emptied the patient's bladder to ensure all debris was removed and left the bladder empty and removed all instrumentation. Again, the stent strings were  trimmed to a short dangle and the patient was awoken from anesthesia and taken to the recovery area.   Post-Op Plan/Instructions:         1) Discharge home when meets PACU criteria and voids x 1;  2) patient to remove ureteral stent by pulling black strings in 3 days at home or may call clinic and have one of clinic nurses do it as well;  3) return to clinic in 2 weeks to see an NP and 6 weeks to see Dr. Gaynelle Arabian.  Attestation: Dr. Gaynelle Arabian was present and scrubbed for the entirety of the procedure.

## 2015-02-01 NOTE — Anesthesia Preprocedure Evaluation (Signed)
Anesthesia Evaluation  Patient identified by MRN, date of birth, ID band Patient awake    Reviewed: Allergy & Precautions, NPO status , Patient's Chart, lab work & pertinent test results  Airway Mallampati: I  TM Distance: >3 FB Neck ROM: Full    Dental   Pulmonary    Pulmonary exam normal        Cardiovascular Normal cardiovascular exam     Neuro/Psych    GI/Hepatic   Endo/Other    Renal/GU      Musculoskeletal   Abdominal   Peds  Hematology   Anesthesia Other Findings   Reproductive/Obstetrics                             Anesthesia Physical Anesthesia Plan  ASA: II  Anesthesia Plan: General   Post-op Pain Management:    Induction: Intravenous  Airway Management Planned: Oral ETT  Additional Equipment:   Intra-op Plan:   Post-operative Plan: Extubation in OR  Informed Consent: I have reviewed the patients History and Physical, chart, labs and discussed the procedure including the risks, benefits and alternatives for the proposed anesthesia with the patient or authorized representative who has indicated his/her understanding and acceptance.     Plan Discussed with: CRNA and Surgeon  Anesthesia Plan Comments:         Anesthesia Quick Evaluation  

## 2015-02-01 NOTE — Anesthesia Procedure Notes (Signed)
Procedure Name: Intubation Date/Time: 02/01/2015 5:44 PM Performed by: Lajuana Carry E Pre-anesthesia Checklist: Patient identified, Emergency Drugs available, Suction available and Patient being monitored Patient Re-evaluated:Patient Re-evaluated prior to inductionOxygen Delivery Method: Circle System Utilized Preoxygenation: Pre-oxygenation with 100% oxygen Intubation Type: IV induction Ventilation: Mask ventilation without difficulty Laryngoscope Size: Mac and 3 Grade View: Grade II Tube type: Oral Tube size: 7.5 mm Number of attempts: 2 (first attempt by CRNA w/ Sabra Heck 2, limited view, atraumatic w/ MAC 3 by Dr. Conrad ) Airway Equipment and Method: Stylet Placement Confirmation: ETT inserted through vocal cords under direct vision,  positive ETCO2 and breath sounds checked- equal and bilateral Secured at: 21 cm Tube secured with: Tape Dental Injury: Teeth and Oropharynx as per pre-operative assessment

## 2015-02-01 NOTE — H&P (Signed)
Reason For Visit Kidney stone   Active Problems Problems  1. Calculus of left ureter (N20.1) 2. Renal colic on left side (Q000111Q)  History of Present Illness     44 yo male referred by the ER for further management of a kidney stone. He was seen in the ER on Friday11/18/16, with L flank pain, nausea, and vomiting, for several hours. However, he had no x-rays and was given Vicodin/nausea medication, and discharged. He returned to [facility] on Sunday, 01/30/15 for recurrent Lt renal colic with L flank pain, LLQ abdominal pain, nausea, vomiting, and gross hematuria. CT showed a 4mm obstructing Lt distal ureteral stone, and multiple stones in both kidneys.    No family hx of stones. No medical illnesses. No sodas. Regular diet. Fast foods: minimal.    Today, pain is controlled with pain med ( Percocet), but pain returns when med wears off. Also Flomax. Motrin 400mg  q 6h. Urine strainer. given in [facility] ED.   Past Medical History Problems  1. History of No significant past medical history  Surgical History Problems  1. History of No Surgical Problems  Current Meds 1. Motrin TABS;  Therapy: (Recorded:22Nov2016) to Recorded 2. Percocet 5-325 MG Oral Tablet;  Therapy: (Recorded:22Nov2016) to Recorded 3. Tamsulosin HCl - 0.4 MG Oral Capsule;  Therapy: (Recorded:22Nov2016) to Recorded  Allergies Medication  1. No Known Drug Allergies  Family History Problems  1. No pertinent family history  Social History Problems    Caffeine use (F15.90)   Married   Never a smoker   No alcohol use   Number of children   Occupation  Review of Systems Genitourinary, constitutional, skin, eye, otolaryngeal, hematologic/lymphatic, cardiovascular, pulmonary, endocrine, musculoskeletal, gastrointestinal, neurological and psychiatric system(s) were reviewed and pertinent findings if present are noted and are otherwise negative.  Genitourinary: urinary frequency, nocturia and hematuria.   Gastrointestinal: nausea, vomiting, flank pain, abdominal pain and constipation.  Musculoskeletal: back pain.    Vitals Vital Signs [Data Includes: Last 1 Day]  Recorded: 22Nov2016 09:02AM  Height: 5 ft 5 in Weight: 178 lb  BMI Calculated: 29.62 BSA Calculated: 1.88 Blood Pressure: 129 / 89 Temperature: 97.2 F Heart Rate: 67  Physical Exam Constitutional: Well nourished and well developed . No acute distress.  ENT:. The ears and nose are normal in appearance.  Neck: The appearance of the neck is normal and no neck mass is present.  Pulmonary: No respiratory distress and normal respiratory rhythm and effort.  Cardiovascular: Heart rate and rhythm are normal . No peripheral edema.  Abdomen: The abdomen is soft and nontender. No masses are palpated. No CVA tenderness. No hernias are palpable. No hepatosplenomegaly noted.  Genitourinary: Examination of the penis demonstrates no discharge, no masses, no lesions and a normal meatus. The penis is uncircumcised. The scrotum is normal in appearance and without lesions. The right epididymis is palpably normal and non-tender. The left epididymis is palpably normal and non-tender. The right testis is non-tender and without masses. The left testis is non-tender and without masses.  Lymphatics: The femoral and inguinal nodes are not enlarged or tender.  Skin: Normal skin turgor, no visible rash and no visible skin lesions.  Neuro/Psych:. Mood and affect are appropriate.    Results/Data Urine [Data Includes: Last 1 Day]   [id number]  COLOR YELLOW   APPEARANCE CLEAR   SPECIFIC GRAVITY 1.025   pH 5.5   GLUCOSE NEGATIVE   BILIRUBIN NEGATIVE   KETONE NEGATIVE   BLOOD 1+   PROTEIN NEGATIVE  NITRITE NEGATIVE   LEUKOCYTE ESTERASE NEGATIVE   SQUAMOUS EPITHELIAL/HPF NONE SEEN HPF  WBC 0-5 WBC/HPF  RBC 0-2 RBC/HPF  BACTERIA NONE SEEN HPF  CRYSTALS NONE SEEN HPF  CASTS NONE SEEN LPF  Yeast NONE SEEN HPF   CT review from [facility]: films show 31mm L  u-v junction stone with L hydronephrosius. In addition, pt has Right upper renal pelvic stone, 64mm, and a RLP calyx stone, 43mm.   Assessment Assessed  1. Renal colic on left side (Q000111Q) 2. Calculus of left ureter (N20.1)  1. Continued L renal colic from L u-v junction stone, ith n/v despite meds. Better with Toradol today. He will need basket extraction tonight.  2. In addition, He will need litholink post-op ( bilateral stones). He may need c++ labs also post op for w/u.   3. Very upset wit care from [facility] on Friday night. will need further evaluation.   Plan Calculus of left ureter, Renal colic on left side  1. Administered: Ketorolac Tromethamine 60 MG/2ML Injection Solution 2. Follow-up Schedule Surgery Office  Follow-up  Status: Hold For - Appointment   Requested for: [phone]  stone extraction tonight.   Signatures Electronically signed by : Carolan Clines, M.D.; Feb 01 2015 10:24AM EST

## 2015-02-01 NOTE — Interval H&P Note (Signed)
History and Physical Interval Note:  02/01/2015 5:15 PM  [facility] [place]  has presented today for surgery, with the diagnosis of left ureteral stone  The various methods of treatment have been discussed with the patient and family. After consideration of risks, benefits and other options for treatment, the patient has consented to  Procedure(s): CYSTOSCOPY/RETROGRADE/URETEROSCOPY/STONE EXTRACTION WITH BASKET (Left) as a surgical intervention .  The patient's history has been reviewed, patient examined, no change in status, stable for surgery.  I have reviewed the patient's chart and labs.  Questions were answered to the patient's satisfaction.    L hand and L flank marked. t  Kaitland Lewellyn I Mady Oubre

## 2015-02-02 ENCOUNTER — Encounter (HOSPITAL_COMMUNITY): Payer: Self-pay | Admitting: Urology

## 2015-02-09 NOTE — Discharge Summary (Signed)
Physician Discharge Summary  Patient ID: Danny Zimmerman MRN: [id number] DOB/AGE: Aug 05, 1970 44 y.o.  Admit date: 02/01/2015 Discharge date: 02/09/2015  Admission Diagnoses: Left ureteral stone  Discharge Diagnoses: Same Active Problems:   * No active hospital problems. *   Discharged Condition: good  Hospital Course:  He was admitted from the ER and taken directly to the OR for left ureteroscopic stone extraction with laser lithotripsy and left ureteral stent placement. He did well and after he recovered from anesthesia he was discharged home in excellent condition.  Consults: None  Significant Diagnostic Studies: none  Treatments: surgery: Left ureteroscopic stone extraction with laser lithotripsy and left ureteral stent placement.  Discharge Exam: Blood pressure 132/82, pulse 68, temperature 98.6 F (37 C), temperature source Oral, resp. rate 17, height 5\' 5"  (1.651 m), weight 79.493 kg (175 lb 4 oz), SpO2 95 %. General appearance: alert, cooperative and appears stated age Head: Normocephalic, without obvious abnormality, atraumatic Resp: clear to auscultation bilaterally GI: soft, non-tender; bowel sounds normal; no masses,  no organomegaly Male genitalia: normal Extremities: extremities normal, atraumatic, no cyanosis or edema  Disposition: 01-Home or Self Care     Medication List    STOP taking these medications        oxyCODONE-acetaminophen 5-325 MG tablet  Commonly known as:  PERCOCET/ROXICET      TAKE these medications        ciprofloxacin 500 MG tablet  Commonly known as:  CIPRO  Take 1 tablet (500 mg total) by mouth 2 (two) times daily.     HYDROcodone-acetaminophen 5-325 MG tablet  Commonly known as:  NORCO  Take 1 tablet by mouth every 6 (six) hours as needed for moderate pain.     ibuprofen 400 MG tablet  Commonly known as:  ADVIL,MOTRIN  Take 1 tablet (400 mg total) by mouth every 6 (six) hours as needed.     ketorolac 30 MG/ML injection   Commonly known as:  TORADOL  Inject 30 mg into the vein once.     ondansetron 4 MG disintegrating tablet  Commonly known as:  ZOFRAN ODT  Take 1 tablet (4 mg total) by mouth every 8 (eight) hours as needed for nausea or vomiting.     STOOL SOFTENER PO  Take 1 capsule by mouth once.     tamsulosin 0.4 MG Caps capsule  Commonly known as:  FLOMAX  Take 1 capsule (0.4 mg total) by mouth daily.           Follow-up Information    Schedule an appointment as soon as possible for a visit with Ailene Rud, MD.   Specialty:  Urology   Why:  Follow up with Nurse Practitioner at [facility] in 2 weeks and follow up to see Dr. Gaynelle Arabian in 6 weeks. Please call the clinic to make these appointments.   Contact information:   [place] [address] [phone]       Signed: Acie Fredrickson 02/09/2015, 1:50 PM

## 2016-01-21 ENCOUNTER — Emergency Department (HOSPITAL_COMMUNITY): Payer: 59

## 2016-01-21 ENCOUNTER — Emergency Department (HOSPITAL_COMMUNITY)
Admission: EM | Admit: 2016-01-21 | Discharge: 2016-01-21 | Disposition: A | Discharge status: Home / Self Care | Payer: 59 | Attending: Emergency Medicine | Admitting: Emergency Medicine

## 2016-01-21 ENCOUNTER — Encounter (HOSPITAL_COMMUNITY): Payer: Self-pay | Admitting: Emergency Medicine

## 2016-01-21 DIAGNOSIS — R0602 Shortness of breath: Secondary | ICD-10-CM | POA: Diagnosis present

## 2016-01-21 LAB — CBC WITH DIFFERENTIAL/PLATELET
BASOS ABS: 0 10*3/uL (ref 0.0–0.1)
Basophils Relative: 0 %
Eosinophils Absolute: 0.1 10*3/uL (ref 0.0–0.7)
Eosinophils Relative: 1 %
HEMATOCRIT: 44.4 % (ref 39.0–52.0)
Hemoglobin: 15.3 g/dL (ref 13.0–17.0)
LYMPHS ABS: 2.9 10*3/uL (ref 0.7–4.0)
LYMPHS PCT: 42 %
MCH: 28.9 pg (ref 26.0–34.0)
MCHC: 34.5 g/dL (ref 30.0–36.0)
MCV: 83.8 fL (ref 78.0–100.0)
MONO ABS: 0.4 10*3/uL (ref 0.1–1.0)
Monocytes Relative: 6 %
NEUTROS ABS: 3.6 10*3/uL (ref 1.7–7.7)
Neutrophils Relative %: 51 %
Platelets: 320 10*3/uL (ref 150–400)
RBC: 5.3 MIL/uL (ref 4.22–5.81)
RDW: 12.5 % (ref 11.5–15.5)
WBC: 7 10*3/uL (ref 4.0–10.5)

## 2016-01-21 LAB — BASIC METABOLIC PANEL
ANION GAP: 7 (ref 5–15)
BUN: 10 mg/dL (ref 6–20)
CHLORIDE: 104 mmol/L (ref 101–111)
CO2: 26 mmol/L (ref 22–32)
Calcium: 9.2 mg/dL (ref 8.9–10.3)
Creatinine, Ser: 0.96 mg/dL (ref 0.61–1.24)
GFR calc Af Amer: >60 mL/min (ref >60)
GLUCOSE: 128 mg/dL — AB (ref 65–99)
POTASSIUM: 4.3 mmol/L (ref 3.5–5.1)
Sodium: 137 mmol/L (ref 135–145)

## 2016-01-21 LAB — I-STAT TROPONIN, ED: Troponin i, poc: 0 ng/mL (ref 0.00–0.08)

## 2016-01-21 NOTE — Discharge Instructions (Signed)
Follow-up with gastroenterology if your symptoms persist to discuss an endoscopy.  Return to the emergency department in the meantime if symptoms significantly worsen or change.

## 2016-01-21 NOTE — ED Provider Notes (Signed)
Danny Zimmerman Provider Note   CSN: [id number] Arrival date & time: 01/21/16  1610     History   Chief Complaint Chief Complaint  Patient presents with  . Shortness of Breath    HPI Danny Zimmerman is a 45 y.o. male.  Patient is a 45 year old male with no significant past medical history. He presents for evaluation of shortness of breath and difficulty swallowing. This occurs when he eats. He reports several episodes over the past few weeks where he develops difficulty breathing. This lasts for several minutes and usually resolves with drinking water. He denies any throat pain. He denies any history of acid reflux or heartburn.   The history is provided by the patient.  Shortness of Breath  This is a new problem. The average episode lasts 3 weeks. The problem occurs intermittently.The problem has not changed since onset.Associated symptoms include a fever. Pertinent negatives include no cough and no chest pain. He has tried nothing for the symptoms. The treatment provided no relief. Associated medical issues do not include asthma, COPD or pneumonia.    Past Medical History:  Diagnosis Date  . Ureteral stone     There are no active problems to display for this patient.   Past Surgical History:  Procedure Laterality Date  . CYSTOSCOPY/RETROGRADE/URETEROSCOPY/STONE EXTRACTION WITH BASKET Left 02/01/2015   Procedure: CYSTOSCOPY/LEFT RETROGRADE/LEFT URETERAL BALLON DILITATION/LEFT /URETEROSCOPY/STONE EXTRACTION WITH BASKET LEFT URETERAL STENT;  Surgeon: Carolan Clines, MD;  Location: [facility];  Service: Urology;  Laterality: Left;       Home Medications    Prior to Admission medications   Medication Sig Start Date End Date Taking? Authorizing Provider  ciprofloxacin (CIPRO) 500 MG tablet Take 1 tablet (500 mg total) by mouth 2 (two) times daily. 02/01/15   Acie Fredrickson, MD  Docusate Calcium (STOOL SOFTENER PO) Take 1 capsule by mouth once.    Historical Provider,  MD  HYDROcodone-acetaminophen (NORCO) 5-325 MG tablet Take 1 tablet by mouth every 6 (six) hours as needed for moderate pain. 02/01/15   Acie Fredrickson, MD  ibuprofen (ADVIL,MOTRIN) 400 MG tablet Take 1 tablet (400 mg total) by mouth every 6 (six) hours as needed. Patient taking differently: Take 400 mg by mouth every 6 (six) hours as needed for moderate pain.  01/30/15   Charlesetta Shanks, MD  ketorolac (TORADOL) 30 MG/ML injection Inject 30 mg into the vein once.    Historical Provider, MD  ondansetron (ZOFRAN ODT) 4 MG disintegrating tablet Take 1 tablet (4 mg total) by mouth every 8 (eight) hours as needed for nausea or vomiting. 01/28/15   Anne Ng, PA-C  tamsulosin (FLOMAX) 0.4 MG CAPS capsule Take 1 capsule (0.4 mg total) by mouth daily. 01/30/15   Charlesetta Shanks, MD    Family History No family history on file.  Social History Social History  Substance Use Topics  . Smoking status: Never Smoker  . Smokeless tobacco: Not on file  . Alcohol use Yes     Comment: socially     Allergies   Patient has no known allergies.   Review of Systems Review of Systems  Constitutional: Positive for fever.  Respiratory: Positive for shortness of breath. Negative for cough.   Cardiovascular: Negative for chest pain.  All other systems reviewed and are negative.    Physical Exam Updated Vital Signs BP 141/95 (BP Location: Left Arm)   Pulse 78   Temp 97.7 F (36.5 C) (Oral)   Resp 20   SpO2 99%  Physical Exam  Constitutional: He is oriented to person, place, and time. He appears well-developed and well-nourished. No distress.  HENT:  Head: Normocephalic and atraumatic.  Mouth/Throat: Oropharynx is clear and moist.  Neck: Normal range of motion. Neck supple.  Cardiovascular: Normal rate and regular rhythm.  Exam reveals no friction rub.   No murmur heard. Pulmonary/Chest: Effort normal and breath sounds normal. No respiratory distress. He has no wheezes. He has no rales.    Abdominal: Soft. Bowel sounds are normal. He exhibits no distension. There is no tenderness.  Musculoskeletal: Normal range of motion. He exhibits no edema.  Lymphadenopathy:    He has no cervical adenopathy.  Neurological: He is alert and oriented to person, place, and time. Coordination normal.  Skin: Skin is warm and dry. He is not diaphoretic.  Nursing note and vitals reviewed.    ED Treatments / Results  Labs (all labs ordered are listed, but only abnormal results are displayed) Labs Reviewed  BASIC METABOLIC PANEL  CBC WITH DIFFERENTIAL/PLATELET  Randolm Idol, ED    EKG  EKG Interpretation  Date/Time:  Saturday January 21 2016 16:47:55 EST Ventricular Rate:  79 PR Interval:    QRS Duration: 93 QT Interval:  343 QTC Calculation: 394 R Axis:   34 Text Interpretation:  Sinus rhythm Low voltage, precordial leads Confirmed by Akera Snowberger  MD, Endia Moncur ([id number]) on 01/21/2016 4:52:57 PM       Radiology No results found.  Procedures Procedures (including critical care time)  Medications Ordered in ED Medications - No data to display   Initial Impression / Assessment and Plan / ED Course  I have reviewed the triage vital signs and the nursing notes.  Pertinent labs & imaging results that were available during my care of the patient were reviewed by me and considered in my medical decision making (see chart for details).  Clinical Course     Patient presents with complaints of intermittent difficulty breathing that seems to occur with eating. I'm suspicious for an esophageal etiology, whether a stricture or dysmotility. His cardiac workup is unremarkable and I see nothing of concern on his chest x-ray. I feel as though he is appropriate for discharge. If his symptoms persist, he will be given the follow-up information for gastroenterology with whom he can discuss endoscopy.  Final Clinical Impressions(s) / ED Diagnoses   Final diagnoses:  None    New  Prescriptions New Prescriptions   No medications on file     Veryl Speak, MD 01/21/16 1811

## 2016-01-21 NOTE — ED Triage Notes (Signed)
Per pt, states he felt like his throat was closing when he was eating-states he felt SOB-states it has happened before-feels like something get stuck in throat when he eats

## 2016-01-21 NOTE — ED Notes (Signed)
Pt states 4 episodes of feeling like something stuck in throat post eating over course of 3 weeks. Pt lung sounds clear. Pt denies hives, rash, or swelling. With assessment no obstruction to airway noted.

## 2016-01-23 ENCOUNTER — Encounter: Payer: Self-pay | Admitting: Gastroenterology

## 2016-02-06 ENCOUNTER — Ambulatory Visit: Payer: 59 | Admitting: Gastroenterology

## 2016-02-06 ENCOUNTER — Encounter: Payer: Self-pay | Admitting: Gastroenterology

## 2016-02-06 ENCOUNTER — Ambulatory Visit (INDEPENDENT_AMBULATORY_CARE_PROVIDER_SITE_OTHER): Payer: 59 | Admitting: Gastroenterology

## 2016-02-06 VITALS — BP 106/60 | HR 76 | Ht 65.0 in | Wt 178.1 lb

## 2016-02-06 DIAGNOSIS — R0602 Shortness of breath: Secondary | ICD-10-CM | POA: Diagnosis not present

## 2016-02-06 DIAGNOSIS — K219 Gastro-esophageal reflux disease without esophagitis: Secondary | ICD-10-CM

## 2016-02-06 DIAGNOSIS — R0989 Other specified symptoms and signs involving the circulatory and respiratory systems: Secondary | ICD-10-CM

## 2016-02-06 DIAGNOSIS — F458 Other somatoform disorders: Secondary | ICD-10-CM | POA: Diagnosis not present

## 2016-02-06 MED ORDER — OMEPRAZOLE 40 MG PO CPDR: 40.0000 mg | DELAYED_RELEASE_CAPSULE | Freq: Every day | ORAL | 3 refills | Status: DC

## 2016-02-06 NOTE — Progress Notes (Signed)
History of Present Illness: This is a 45 year old male referred by Veryl Speak, MD for the evaluation of reflux, shortness of breath and throat closing. Patient relates episodes of reflux following meals and at night on occasion for several months. His symptoms worsened over the past several weeks and are clearly exacerbated by spicy foods. He has a sensation of his throat closing with difficulty swallowing, regurgitation and shortness of breath. He was evaluated in the ED on 11/11. Labwork was unremarkable except for an elevated glucose of 128. EKG and chest x-ray unremarkable. He has not been treated with any over-the-counter or prescription acid reducing medications. He complains of frequent low back pain. He does not have a PCP. Denies weight loss, abdominal pain, constipation, diarrhea, change in stool caliber, melena, hematochezia, nausea, vomiting, dysphagia, chest pain.  No Known Allergies Outpatient Medications Prior to Visit  Medication Sig Dispense Refill  . ibuprofen (ADVIL,MOTRIN) 400 MG tablet Take 1 tablet (400 mg total) by mouth every 6 (six) hours as needed. (Patient taking differently: Take 400 mg by mouth every 6 (six) hours as needed for moderate pain. ) 15 tablet 0  . ciprofloxacin (CIPRO) 500 MG tablet Take 1 tablet (500 mg total) by mouth 2 (two) times daily. 6 tablet 0  . Docusate Calcium (STOOL SOFTENER PO) Take 1 capsule by mouth once.    Marland Kitchen HYDROcodone-acetaminophen (NORCO) 5-325 MG tablet Take 1 tablet by mouth every 6 (six) hours as needed for moderate pain. 31 tablet 0  . ketorolac (TORADOL) 30 MG/ML injection Inject 30 mg into the vein once.    . ondansetron (ZOFRAN ODT) 4 MG disintegrating tablet Take 1 tablet (4 mg total) by mouth every 8 (eight) hours as needed for nausea or vomiting. 20 tablet 0  . tamsulosin (FLOMAX) 0.4 MG CAPS capsule Take 1 capsule (0.4 mg total) by mouth daily. 30 capsule 0   No facility-administered medications prior to visit.    Past  Medical History:  Diagnosis Date  . Ureteral stone    Past Surgical History:  Procedure Laterality Date  . CYSTOSCOPY/RETROGRADE/URETEROSCOPY/STONE EXTRACTION WITH BASKET Left 02/01/2015   Procedure: CYSTOSCOPY/LEFT RETROGRADE/LEFT URETERAL BALLON DILITATION/LEFT /URETEROSCOPY/STONE EXTRACTION WITH BASKET LEFT URETERAL STENT;  Surgeon: Carolan Clines, MD;  Location: [facility];  Service: Urology;  Laterality: Left;   Social History   Social History  . Marital status: Married    Spouse name: N/A  . Number of children: 3  . Years of education: N/A   Occupational History  . Therapist, nutritional    Social History Main Topics  . Smoking status: Never Smoker  . Smokeless tobacco: Never Used  . Alcohol use Yes     Comment: socially  . Drug use: No  . Sexual activity: Yes    Partners: Female   Other Topics Concern  . None   Social History Narrative  . None   Family History  Problem Relation Age of Onset  . Colon cancer Neg Hx   . Esophageal cancer Neg Hx   . Stomach cancer Neg Hx      Review of Systems: Pertinent positive and negative review of systems were noted in the above HPI section. All other review of systems were otherwise negative.   Physical Exam: General: Well developed, well nourished, no acute distress Head: Normocephalic and atraumatic Eyes:  sclerae anicteric, EOMI Ears: Normal auditory acuity Mouth: No deformity or lesions Neck: Supple, no masses or thyromegaly Lungs: Clear throughout to auscultation Heart: Regular rate and  rhythm; no murmurs, rubs or bruits Abdomen: Soft, non tender and non distended. No masses, hepatosplenomegaly or hernias noted. Normal Bowel sounds Rectal:deferred Musculoskeletal: Symmetrical with no gross deformities  Skin: No lesions on visible extremities Pulses:  Normal pulses noted Extremities: No clubbing, cyanosis, edema or deformities noted Neurological: Alert oriented x 4, grossly nonfocal Cervical Nodes:  No significant  cervical adenopathy Inguinal Nodes: No significant inguinal adenopathy Psychological:  Alert and cooperative. Normal mood and affect  Assessment and Recommendations:  1. GERD with regurgitation and suspected globus sensation. Symptoms exacerbated by spicy foods. Follow all standard antireflux measures. Begin omeprazole 40 mg every morning, every day. Tums when necessary. Return office visit in 6-8. Patient is advised to call my office if his symptoms have not significantly improved over the next 2-3 weeks. If symptoms not adequately controlled proceed with EGD.  2. SOB and low back pain. PCP referral for further evaluation.   cc: Veryl Speak, MD

## 2016-02-06 NOTE — Patient Instructions (Signed)
We have sent the following medications to your pharmacy for you to pick up at your convenience: omeprazole.  Patient advised to avoid spicy, acidic, citrus, chocolate, mints, fruit and fruit juices.  Limit the intake of caffeine, alcohol and Soda.  Don't exercise too soon after eating.  Don't lie down within 3-4 hours of eating.  Elevate the head of your bed.  Please schedule a follow-up visit with Dr. Scarlette Calico or Terri Piedra, FNP to have your shortness of breath and back pain addressed.  Thank you for choosing me and [facility].  Pricilla Riffle. Dagoberto Ligas., MD., Marval Regal

## 2016-03-12 HISTORY — PX: UPPER GASTROINTESTINAL ENDOSCOPY: SHX188

## 2016-03-30 ENCOUNTER — Ambulatory Visit: Payer: 59 | Admitting: Gastroenterology

## 2016-05-09 ENCOUNTER — Ambulatory Visit (INDEPENDENT_AMBULATORY_CARE_PROVIDER_SITE_OTHER): Payer: 59 | Admitting: Gastroenterology

## 2016-05-09 ENCOUNTER — Encounter: Payer: Self-pay | Admitting: Gastroenterology

## 2016-05-09 VITALS — BP 124/68 | HR 72 | Ht 65.0 in | Wt 175.0 lb

## 2016-05-09 DIAGNOSIS — K219 Gastro-esophageal reflux disease without esophagitis: Secondary | ICD-10-CM

## 2016-05-09 NOTE — Patient Instructions (Signed)
Patient advised to avoid spicy, acidic, citrus, chocolate, mints, fruit and fruit juices.  Limit the intake of caffeine, alcohol and Soda.  Don't exercise too soon after eating.  Don't lie down within 3-4 hours of eating.  Elevate the head of your bed.  You can decrease your omeprazole to every other day x 1 week, then decrease to every third day x 1 week, then discontinue.   Follow up with Dr. Fuller Plan as needed.   Thank you for choosing me and [facility].  Pricilla Riffle. Dagoberto Ligas., MD., Marval Regal

## 2016-05-09 NOTE — Progress Notes (Signed)
    History of Present Illness: This is a 46 year old male returning for follow-up of GERD. His reflux symptoms are under very good control on antireflux measures and omeprazole 40 mg daily. He states he went out of town for several days and forgot his Nexium and did not note any breakthrough reflux symptoms however he did develop an acute gastroenteritis with nausea vomiting diarrhea that promptly resolved.  Current Medications, Allergies, Past Medical History, Past Surgical History, Family History and Social History were reviewed in [employer] record.  Physical Exam: General: Well developed, well nourished, no acute distress Head: Normocephalic and atraumatic Eyes:  sclerae anicteric, EOMI Ears: Normal auditory acuity Mouth: No deformity or lesions Lungs: Clear throughout to auscultation Heart: Regular rate and rhythm; no murmurs, rubs or bruits Abdomen: Soft, non tender and non distended. No masses, hepatosplenomegaly or hernias noted. Normal Bowel sounds Musculoskeletal: Symmetrical with no gross deformities  Pulses:  Normal pulses noted Extremities: No clubbing, cyanosis, edema or deformities noted Neurological: Alert oriented x 4, grossly nonfocal Psychological:  Alert and cooperative. Normal mood and affect  Assessment and Recommendations:  1. GERD. Continue antireflux measures long-term. Taper omeprazole 40 mg to every other day for 1 week and then every third day for 1 week and then discontinue. Resume if symptoms recur. His symptoms are frequently bothersome or he needs to remain on medications will proceed with EGD for further evaluation. Return office visit in 6 months if he requires medication use to control GERD otherwise prn.

## 2016-12-12 ENCOUNTER — Ambulatory Visit (INDEPENDENT_AMBULATORY_CARE_PROVIDER_SITE_OTHER): Payer: 59 | Admitting: Physician Assistant

## 2016-12-12 ENCOUNTER — Encounter: Payer: Self-pay | Admitting: Physician Assistant

## 2016-12-12 ENCOUNTER — Encounter (INDEPENDENT_AMBULATORY_CARE_PROVIDER_SITE_OTHER): Payer: Self-pay

## 2016-12-12 ENCOUNTER — Encounter: Payer: Self-pay | Admitting: Gastroenterology

## 2016-12-12 VITALS — BP 120/78 | HR 72 | Ht 65.5 in | Wt 177.2 lb

## 2016-12-12 DIAGNOSIS — R1310 Dysphagia, unspecified: Secondary | ICD-10-CM

## 2016-12-12 DIAGNOSIS — K219 Gastro-esophageal reflux disease without esophagitis: Secondary | ICD-10-CM

## 2016-12-12 MED ORDER — OMEPRAZOLE 40 MG PO CPDR: 40.0000 mg | DELAYED_RELEASE_CAPSULE | Freq: Every day | ORAL | 2 refills | Status: DC

## 2016-12-12 NOTE — Progress Notes (Signed)
Reviewed and agree with management plan.  Taimi Towe T. Tyton Abdallah, MD FACG 

## 2016-12-12 NOTE — Progress Notes (Signed)
Chief Complaint: GERD, dysphagia  HPI:   Danny Zimmerman is a 46 year old male who returns to clinic today with a complaint of dysphagia and reflux.   Please recall patient follows with Dr. Fuller Plan and was last seen in clinic 05/09/16. At that time, patient was doing well on anti-reflux measures and Omeprazole 40 mg daily. In fact, the patient had missed some of his Omeprazole dosing and had not had symptoms. At that time, it was discussed that he should taper off of this medication. it was noted that if he had return of symptoms or if his symptoms required Omeprazole on a daily basis in the future, then they would proceed with an EGD for further evaluation in the future.    Today, the patient presents to clinic accompanied by his Zimmerman. He explains that ever since stopping his Omeprazole he has had symptoms of reflux. Per his Zimmerman these are worse at night and sometimes wake her from her sleep when he is "choking" on acidic material. Patient and his Zimmerman also describe that he has had trouble when eating dinner on multiple occasions, feeling like food is getting stuck on its way down. This has never occurred with liquids. The patient has remained off of his Omeprazole 40 mg as instructed by Dr. Fuller Plan and tells me that he is ready for a closer look today.   Patient also describes history of what sounds like hypothyroidism diagnosed about 15 years ago, patient never took medication for this but wonders if it has anything to do with the "choking sensation".   Patient denies fever, chills, blood in his stool, melena, weight loss, fatigue, anorexia, nausea, vomiting, change in bowel habits or abdominal pain.  Past Medical History:  Diagnosis Date  . GERD (gastroesophageal reflux disease)   . Ureteral stone     Past Surgical History:  Procedure Laterality Date  . CYSTOSCOPY/RETROGRADE/URETEROSCOPY/STONE EXTRACTION WITH BASKET Left 02/01/2015   Procedure: CYSTOSCOPY/LEFT RETROGRADE/LEFT URETERAL BALLON  DILITATION/LEFT /URETEROSCOPY/STONE EXTRACTION WITH BASKET LEFT URETERAL STENT;  Surgeon: Carolan Clines, MD;  Location: [facility];  Service: Urology;  Laterality: Left;    Current Outpatient Prescriptions  Medication Sig Dispense Refill  . ibuprofen (ADVIL,MOTRIN) 400 MG tablet Take 1 tablet (400 mg total) by mouth every 6 (six) hours as needed. (Patient taking differently: Take 400 mg by mouth every 6 (six) hours as needed for moderate pain. ) 15 tablet 0  . omeprazole (PRILOSEC) 40 MG capsule Take 1 capsule (40 mg total) by mouth daily. 90 capsule 3   No current facility-administered medications for this visit.     Allergies as of 12/12/2016  . (No Known Allergies)    Family History  Problem Relation Age of Onset  . Colon cancer Neg Hx   . Esophageal cancer Neg Hx   . Stomach cancer Neg Hx     Social History   Social History  . Marital status: Married    Spouse name: N/A  . Number of children: 3  . Years of education: N/A   Occupational History  . Therapist, nutritional    Social History Main Topics  . Smoking status: Never Smoker  . Smokeless tobacco: Never Used  . Alcohol use Yes     Comment: socially  . Drug use: No  . Sexual activity: Yes    Partners: Female   Other Topics Concern  . Not on file   Social History Narrative  . No narrative on file    Review of Systems:  Constitutional: No weight loss, fever or chills Cardiovascular: No chest pain Respiratory: No SOB  Gastrointestinal: See HPI and otherwise negative   Physical Exam:  Vital signs: Ht 5' 5.5" (1.664 m)   Wt 177 lb 4 oz (80.4 kg)   BMI 29.05 kg/m    Constitutional:   Pleasant Caucasian male appears to be in NAD, Well developed, Well nourished, alert and cooperative Respiratory: Respirations even and unlabored. Lungs clear to auscultation bilaterally.   No wheezes, crackles, or rhonchi.  Cardiovascular: Normal S1, S2. No MRG. Regular rate and rhythm. No peripheral edema, cyanosis or pallor.   Gastrointestinal:  Soft, nondistended, nontender. No rebound or guarding. Normal bowel sounds. No appreciable masses or hepatomegaly. Psychiatric: Demonstrates good judgement and reason without abnormal affect or behaviors.  No recent labs or imaging.  Assessment: 1. GERD: Symptoms throughout the day worse after eating almost anything, wake him from sleep at night 2. Dysphagia: Over the past few months, to solids; consider esophageal stricture most likely versus ring versus web versus mass versus other  Plan: 1. Scheduled patient for an EGD with dilation in the [place] with Dr. Fuller Plan. Did discuss risks, benefits, limitations and alternatives and the patient agrees to proceed 2. Prescribe Omeprazole 40 mg once daily, 30-60 minutes before eating breakfast. #30 with 3 refills 3. Discussed with the patient that after EGD Dr. Fuller Plan may recommend an increased dose of Omeprazole for a period of time 4. Reviewed antireflux diet and lifestyle modifications. 5. Discussed with the patient that if he believes he had thyroid problems in the past then he should establish care with a primary care provider so this can be monitored, I do not believe it is contributing to his choking sensation today. 6. Patient to follow in clinic per recommendations from Dr. Fuller Plan after time of procedure   Ellouise Newer, PA-C [facility] 12/12/2016, 1:15 PM

## 2016-12-12 NOTE — Patient Instructions (Signed)
You have been scheduled for an endoscopy. Please follow written instructions given to you at your visit today. If you use inhalers (even only as needed), please bring them with you on the day of your procedure. Your physician has requested that you go to www.startemmi.com and enter the access code given to you at your visit today. This web site gives a general overview about your procedure. However, you should still follow specific instructions given to you by our office regarding your preparation for the procedure.  We have sent the following medications to your pharmacy for you to pick up at your convenience: Omeprazole 40 mg daily 30-60 minutes before breakfast   We have given you a handout on GERD.

## 2016-12-18 ENCOUNTER — Ambulatory Visit (AMBULATORY_SURGERY_CENTER): Payer: 59 | Admitting: Gastroenterology

## 2016-12-18 ENCOUNTER — Encounter: Payer: Self-pay | Admitting: Gastroenterology

## 2016-12-18 VITALS — BP 109/70 | HR 62 | Temp 98.0°F | Resp 20 | Ht 65.5 in | Wt 177.0 lb

## 2016-12-18 DIAGNOSIS — K219 Gastro-esophageal reflux disease without esophagitis: Secondary | ICD-10-CM | POA: Diagnosis not present

## 2016-12-18 DIAGNOSIS — R1310 Dysphagia, unspecified: Secondary | ICD-10-CM | POA: Diagnosis not present

## 2016-12-18 DIAGNOSIS — K2950 Unspecified chronic gastritis without bleeding: Secondary | ICD-10-CM | POA: Diagnosis not present

## 2016-12-18 DIAGNOSIS — B9681 Helicobacter pylori [H. pylori] as the cause of diseases classified elsewhere: Secondary | ICD-10-CM | POA: Diagnosis not present

## 2016-12-18 DIAGNOSIS — R1319 Other dysphagia: Secondary | ICD-10-CM

## 2016-12-18 MED ORDER — SODIUM CHLORIDE 0.9 % IV SOLN: 500.0000 mL | INTRAVENOUS | Status: DC

## 2016-12-18 MED ORDER — PANTOPRAZOLE SODIUM 40 MG PO TBEC: DELAYED_RELEASE_TABLET | ORAL | 11 refills | Status: DC

## 2016-12-18 NOTE — Progress Notes (Signed)
Called to room to assist during endoscopic procedure.  Patient ID and intended procedure confirmed with present staff. Received instructions for my participation in the procedure from the performing physician.  

## 2016-12-18 NOTE — Patient Instructions (Signed)
YOU HAD AN ENDOSCOPIC PROCEDURE TODAY AT [facility]:   Refer to the procedure report that was given to you for any specific questions about what was found during the examination.  If the procedure report does not answer your questions, please call your gastroenterologist to clarify.  If you requested that your care partner not be given the details of your procedure findings, then the procedure report has been included in a sealed envelope for you to review at your convenience later.  YOU SHOULD EXPECT: Some feelings of bloating in the abdomen. Passage of more gas than usual.  Walking can help get rid of the air that was put into your GI tract during the procedure and reduce the bloating. If you had a lower endoscopy (such as a colonoscopy or flexible sigmoidoscopy) you may notice spotting of blood in your stool or on the toilet paper. If you underwent a bowel prep for your procedure, you may not have a normal bowel movement for a few days.  Please Note:  You might notice some irritation and congestion in your nose or some drainage.  This is from the oxygen used during your procedure.  There is no need for concern and it should clear up in a day or so.  SYMPTOMS TO REPORT IMMEDIATELY:    Following upper endoscopy (EGD)  Vomiting of blood or coffee ground material  New chest pain or pain under the shoulder blades  Painful or persistently difficult swallowing  New shortness of breath  Fever of 100F or higher  Black, tarry-looking stools  For urgent or emergent issues, a gastroenterologist can be reached at any hour by calling [phone].   DIET:  Follow dilation handout.  ACTIVITY:  You should plan to take it easy for the rest of today and you should NOT DRIVE or use heavy machinery until tomorrow (because of the sedation medicines used during the test).    FOLLOW UP: Our staff will call the number listed on your records the next business day following your procedure to  check on you and address any questions or concerns that you may have regarding the information given to you following your procedure. If we do not reach you, we will leave a message.  However, if you are feeling well and you are not experiencing any problems, there is no need to return our call.  We will assume that you have returned to your regular daily activities without incident.  If any biopsies were taken you will be contacted by phone or by letter within the next 1-3 weeks.  Please call us at [phone] if you have not heard about the biopsies in 3 weeks.    SIGNATURES/CONFIDENTIALITY: You and/or your care partner have signed paperwork which will be entered into your electronic medical record.  These signatures attest to the fact that that the information above on your After Visit Summary has been reviewed and is understood.  Full responsibility of the confidentiality of this discharge information lies with you and/or your care-partner.   Resume medication. Information given on Gastritis and Dilation Diet.

## 2016-12-18 NOTE — Op Note (Signed)
[facility] Patient Name: Danny Zimmerman Procedure Date: 12/18/2016 7:29 AM MRN: [id number] Endoscopist: Ladene Artist , MD Age: 46 Referring MD:  Date of Birth: 07/09/1970 Gender: Male Account #: [id number] Procedure:                Upper GI endoscopy Indications:              Dysphagia, Gastro-esophageal reflux disease Medicines:                Monitored Anesthesia Care Procedure:                Pre-Anesthesia Assessment:                           - Prior to the procedure, a History and Physical                            was performed, and patient medications and                            allergies were reviewed. The patient's tolerance of                            previous anesthesia was also reviewed. The risks                            and benefits of the procedure and the sedation                            options and risks were discussed with the patient.                            All questions were answered, and informed consent                            was obtained. Prior Anticoagulants: The patient has                            taken no previous anticoagulant or antiplatelet                            agents. ASA Grade Assessment: II - A patient with                            mild systemic disease. After reviewing the risks                            and benefits, the patient was deemed in                            satisfactory condition to undergo the procedure.                           After obtaining informed consent, the endoscope was  passed under direct vision. Throughout the                            procedure, the patient's blood pressure, pulse, and                            oxygen saturations were monitored continuously. The                            Endoscope was introduced through the mouth, and                            advanced to the second part of duodenum. The upper                            GI endoscopy  was accomplished without difficulty.                            The patient tolerated the procedure well. Scope In: Scope Out: Findings:                 The Z-line was variable and was found at the                            gastroesophageal junction. One tongue of mucosa                            extending 1.5 cm. Biopsies were taken with a cold                            forceps for histology.                           No endoscopic abnormality was evident in the                            esophagus to explain the patient's complaint of                            dysphagia. It was decided, however, to proceed with                            dilation of the entire esophagus. A guidewire was                            placed under fluoroscopic guidance and the scope                            was withdrawn. Dilation was performed with a Savary                            dilator with no resistance at 16 mm. Small amount  of heme noted, likely from gastric and esophageal                            biopsies.                           A few localized, small non-bleeding erosions were                            found in the prepyloric region of the stomach.                            There were no stigmata of recent bleeding. Biopsies                            were taken with a cold forceps for histology and                            Helicobacter pylori testing.                           Diffuse mild inflammation characterized by                            erythema, friability and granularity was found in                            the gastric body and in the gastric antrum.                            Biopsies were taken with a cold forceps for                            histology and Helicobacter pylori testing.                           The exam of the stomach was otherwise normal.                           The duodenal bulb and second portion of the                             duodenum were normal. Complications:            No immediate complications. Estimated Blood Loss:     Estimated blood loss was minimal. Impression:               - Z-line variable, at the gastroesophageal                            junction. Biopsied.                           - No endoscopic esophageal abnormality to explain  patient's dysphagia. Esophagus dilated. Dilated.                           - Non-bleeding erosive gastropathy. Biopsied.                           - Gastritis. Biopsied.                           - Normal duodenal bulb and second portion of the                            duodenum. Recommendation:           - Patient has a contact number available for                            emergencies. The signs and symptoms of potential                            delayed complications were discussed with the                            patient. Return to normal activities tomorrow.                            Written discharge instructions were provided to the                            patient.                           - Clear liquid diet for 2 hours then advance to                            soft diet today, then advance as tolerated tomorrow.                           - Continue present medications.                           - Protonix (pantoprazole) 40 mg PO BID, 1 year of                            refills                           - DC omeprazole.                           - Return to GI office in 6 weeks. Ladene Artist, MD 12/18/2016 8:24:47 AM This report has been signed electronically.

## 2016-12-18 NOTE — Progress Notes (Signed)
Report to PACU, RN, vss, BBS= Clear.  

## 2016-12-19 ENCOUNTER — Telehealth: Payer: Self-pay

## 2016-12-19 NOTE — Telephone Encounter (Signed)
  Follow up Call-  Call Danny Zimmerman number 12/18/2016  Post procedure Call Danny Zimmerman phone  # [phone]  Permission to leave phone message Yes  comments not set up for voicemail     Patient questions:  Do you have a fever, pain , or abdominal swelling? No. Pain Score  0 *  Have you tolerated food without any problems? Yes.    Have you been able to return to your normal activities? Yes.    Do you have any questions about your discharge instructions: Diet   No. Medications  No. Follow up visit  No.  Do you have questions or concerns about your Care? No.  Actions: * If pain score is 4 or above: No action needed, pain <4.

## 2016-12-26 ENCOUNTER — Other Ambulatory Visit: Payer: Self-pay

## 2016-12-26 MED ORDER — BIS SUBCIT-METRONID-TETRACYC 140-125-125 MG PO CAPS: 3.0000 | ORAL_CAPSULE | Freq: Three times a day (TID) | ORAL | 0 refills | Status: DC

## 2017-01-24 ENCOUNTER — Other Ambulatory Visit (INDEPENDENT_AMBULATORY_CARE_PROVIDER_SITE_OTHER): Payer: 59

## 2017-01-24 ENCOUNTER — Ambulatory Visit: Payer: 59 | Admitting: Nurse Practitioner

## 2017-01-24 ENCOUNTER — Encounter: Payer: Self-pay | Admitting: Nurse Practitioner

## 2017-01-24 ENCOUNTER — Encounter: Payer: Self-pay | Admitting: Neurology

## 2017-01-24 VITALS — BP 112/76 | HR 72 | Temp 98.2°F | Resp 16 | Ht 65.5 in | Wt 180.4 lb

## 2017-01-24 DIAGNOSIS — R413 Other amnesia: Secondary | ICD-10-CM

## 2017-01-24 DIAGNOSIS — R200 Anesthesia of skin: Secondary | ICD-10-CM | POA: Diagnosis not present

## 2017-01-24 DIAGNOSIS — Z13220 Encounter for screening for lipoid disorders: Secondary | ICD-10-CM

## 2017-01-24 DIAGNOSIS — Z114 Encounter for screening for human immunodeficiency virus [HIV]: Secondary | ICD-10-CM

## 2017-01-24 DIAGNOSIS — Z0001 Encounter for general adult medical examination with abnormal findings: Secondary | ICD-10-CM | POA: Diagnosis not present

## 2017-01-24 LAB — COMPREHENSIVE METABOLIC PANEL
ALBUMIN: 4.6 g/dL (ref 3.5–5.2)
ALT: 41 U/L (ref 0–53)
AST: 24 U/L (ref 0–37)
Alkaline Phosphatase: 74 U/L (ref 39–117)
BILIRUBIN TOTAL: 0.9 mg/dL (ref 0.2–1.2)
BUN: 11 mg/dL (ref 6–23)
CHLORIDE: 103 meq/L (ref 96–112)
CO2: 27 meq/L (ref 19–32)
CREATININE: 0.88 mg/dL (ref 0.40–1.50)
Calcium: 9.5 mg/dL (ref 8.4–10.5)
GFR: 100.01 mL/min (ref >60.00)
Glucose, Bld: 106 mg/dL — ABNORMAL HIGH (ref 70–99)
Potassium: 3.9 mEq/L (ref 3.5–5.1)
SODIUM: 138 meq/L (ref 135–145)
Total Protein: 7.8 g/dL (ref 6.0–8.3)

## 2017-01-24 LAB — LIPID PANEL
CHOL/HDL RATIO: 4
CHOLESTEROL: 197 mg/dL (ref 0–200)
HDL: 47.5 mg/dL (ref >39.00)
LDL CALC: 115 mg/dL — AB (ref 0–99)
NonHDL: 149.03
Triglycerides: 172 mg/dL — ABNORMAL HIGH (ref 0.0–149.0)
VLDL: 34.4 mg/dL (ref 0.0–40.0)

## 2017-01-24 LAB — CBC
HCT: 46.1 % (ref 39.0–52.0)
Hemoglobin: 15.9 g/dL (ref 13.0–17.0)
MCHC: 34.4 g/dL (ref 30.0–36.0)
MCV: 85.7 fl (ref 78.0–100.0)
Platelets: 316 10*3/uL (ref 150.0–400.0)
RBC: 5.38 Mil/uL (ref 4.22–5.81)
RDW: 13.2 % (ref 11.5–15.5)
WBC: 6 10*3/uL (ref 4.0–10.5)

## 2017-01-24 NOTE — Patient Instructions (Signed)
Please head downstairs for lab work.  I have placed a referral to neurology. I have placed an order for an MRI of your brain. Our office will call you to schedule these appointments.  If you experience loss of consciousness, facial drooping, weakness in one side of your body, slurred speech please call 911 immediately.  Please work on your diet and exercise as we discussed. Remember half of your plate should be veggies, one-fourth carbs, one-fourth meat, and don't eat meat at every meal. Also, remember to stay away from sugary drinks. I'd like for you to start incorporating exercise into your daily schedule. Start at 10 minutes a day, working up to 30 minutes five times a week.  Id like to see you back in 3 months to see how you are doing.  It was nice to meet you. Welcome to [facility]!   Preventive Care 40-64 Years, Male Preventive care refers to lifestyle choices and visits with your health care provider that can promote health and wellness. What does preventive care include?  A yearly physical exam. This is also called an annual well check.  Dental exams once or twice a year.  Routine eye exams. Ask your health care provider how often you should have your eyes checked.  Personal lifestyle choices, including: ? Daily care of your teeth and gums. ? Regular physical activity. ? Eating a healthy diet. ? Avoiding tobacco and drug use. ? Limiting alcohol use. ? Practicing safe sex. ? Taking low-dose aspirin every day starting at age 82. What happens during an annual well check? The services and screenings done by your health care provider during your annual well check will depend on your age, overall health, lifestyle risk factors, and family history of disease. Counseling Your health care provider may ask you questions about your:  Alcohol use.  Tobacco use.  Drug use.  Emotional well-being.  Home and relationship well-being.  Sexual activity.  Eating habits.  Work and  work Statistician.  Screening You may have the following tests or measurements:  Height, weight, and BMI.  Blood pressure.  Lipid and cholesterol levels. These may be checked every 5 years, or more frequently if you are over 58 years old.  Skin check.  Lung cancer screening. You may have this screening every year starting at age 39 if you have a 30-pack-year history of smoking and currently smoke or have quit within the past 15 years.  Fecal occult blood test (FOBT) of the stool. You may have this test every year starting at age 35.  Flexible sigmoidoscopy or colonoscopy. You may have a sigmoidoscopy every 5 years or a colonoscopy every 10 years starting at age 20.  Prostate cancer screening. Recommendations will vary depending on your family history and other risks.  Hepatitis C blood test.  Hepatitis B blood test.  Sexually transmitted disease (STD) testing.  Diabetes screening. This is done by checking your blood sugar (glucose) after you have not eaten for a while (fasting). You may have this done every 1-3 years.  Discuss your test results, treatment options, and if necessary, the need for more tests with your health care provider. Vaccines Your health care provider may recommend certain vaccines, such as:  Influenza vaccine. This is recommended every year.  Tetanus, diphtheria, and acellular pertussis (Tdap, Td) vaccine. You may need a Td booster every 10 years.  Varicella vaccine. You may need this if you have not been vaccinated.  Zoster vaccine. You may need this after age 39.  Measles, mumps, and rubella (MMR) vaccine. You may need at least one dose of MMR if you were born in 1957 or later. You may also need a second dose.  Pneumococcal 13-valent conjugate (PCV13) vaccine. You may need this if you have certain conditions and have not been vaccinated.  Pneumococcal polysaccharide (PPSV23) vaccine. You may need one or two doses if you smoke cigarettes or if you  have certain conditions.  Meningococcal vaccine. You may need this if you have certain conditions.  Hepatitis A vaccine. You may need this if you have certain conditions or if you travel or work in places where you may be exposed to hepatitis A.  Hepatitis B vaccine. You may need this if you have certain conditions or if you travel or work in places where you may be exposed to hepatitis B.  Haemophilus influenzae type b (Hib) vaccine. You may need this if you have certain risk factors.  Talk to your health care provider about which screenings and vaccines you need and how often you need them. This information is not intended to replace advice given to you by your health care provider. Make sure you discuss any questions you have with your health care provider. Document Released: 03/25/2015 Document Revised: 11/16/2015 Document Reviewed: 12/28/2014 Elsevier Interactive Patient Education  2017 [employer].

## 2017-01-24 NOTE — Progress Notes (Signed)
Subjective:    Patient ID: Danny Zimmerman, male    DOB: 1971/02/27, 46 y.o.   MRN: [id number]  HPI Mr. Klippel is a 46 yo male who presents today to establish care.    Patient presents today for complete physical.  Immunizations: Tetanus- up to date Flu - declines Vision:annual Dental: biannual Diet: rare fast food, no sodas Breakfast-eggs, toast Lunch-sandwich Dinner-rice and beans Drinks mainly water Minimal fruits and veggies Exercise: Not often- occasionally runs on treadmill, situps and push ups Sunscreen- sometimes; seatbelt- always  Review of Systems  Constitutional: Negative for activity change and appetite change.  HENT: Negative for dental problem and voice change.   Eyes: Negative for visual disturbance.  Respiratory: Negative for cough and shortness of breath.   Cardiovascular: Negative for chest pain and palpitations.  Gastrointestinal: Negative for constipation and diarrhea.  Endocrine: Negative for cold intolerance and heat intolerance.  Genitourinary: Negative for difficulty urinating and hematuria.  Musculoskeletal: Negative for arthralgias and myalgias.  Skin: Negative for rash.  Allergic/Immunologic: Negative for environmental allergies and food allergies.  Neurological: Positive for speech difficulty, weakness and numbness. Negative for dizziness, syncope, facial asymmetry and headaches.       Memory loss.  Hematological: Does not bruise/bleed easily.  Psychiatric/Behavioral:       Negative for depression or anxiety.    Memory loss- began about 7 months ago. Intermittent. Hell occasionally completely forget things such as what he was doing or where he was going. Lasts for minutes and resolves. Hes also noticed intermittent numbness and tingling to bilateral hands and at times loses his grip and drops things hes holding. He has also had some slurred speech. He denies loss of consciousness, unilateral weakness, fevers, vision changes, nausea, vomiting. hes  not having the symptoms today.   Past Medical History:  Diagnosis Date  . GERD (gastroesophageal reflux disease)   . Ureteral stone      Social History   Socioeconomic History  . Marital status: Married    Spouse name: Not on file  . Number of children: 3  . Years of education: Not on file  . Highest education level: Not on file  Social Needs  . Financial resource strain: Not on file  . Food insecurity - worry: Not on file  . Food insecurity - inability: Not on file  . Transportation needs - medical: No  . Transportation needs - non-medical: No  Occupational History  . Occupation: Therapist, nutritional    Comment: [employer]  Tobacco Use  . Smoking status: Never Smoker  . Smokeless tobacco: Never Used  Substance and Sexual Activity  . Alcohol use: Yes    Comment: socially  . Drug use: No  . Sexual activity: Yes    Partners: Female  Other Topics Concern  . Not on file  Social History Narrative   Fun: time with family    Past Surgical History:  Procedure Laterality Date  . CYSTOSCOPY/RETROGRADE/URETEROSCOPY/STONE EXTRACTION WITH BASKET Left 02/01/2015   Procedure: CYSTOSCOPY/LEFT RETROGRADE/LEFT URETERAL BALLON DILITATION/LEFT /URETEROSCOPY/STONE EXTRACTION WITH BASKET LEFT URETERAL STENT;  Surgeon: Carolan Clines, MD;  Location: [facility];  Service: Urology;  Laterality: Left;    Family History  Problem Relation Age of Onset  . Colon cancer Neg Hx   . Esophageal cancer Neg Hx   . Stomach cancer Neg Hx     No Known Allergies  No current outpatient medications on file prior to visit.   No current facility-administered medications on file prior to visit.  Objective:   Physical Exam  BP 112/76 (BP Location: Left Arm, Patient Position: Sitting, Cuff Size: Large)   Pulse 72   Temp 98.2 F (36.8 C) (Oral)   Resp 16   Ht 5' 5.5" (1.664 m)   Wt 180 lb 6.4 oz (81.8 kg)   SpO2 97%   BMI 29.56 kg/m   General Appearance:    Alert, cooperative, no  distress, appears stated age  Head:    Normocephalic, without obvious abnormality, atraumatic  Eyes:    PERRL, conjunctiva/corneas clear, EOM's intact.  Ears:    Normal TM's and external ear canals, both ears  Nose:   Nares normal, septum midline, mucosa normal, no drainage   or sinus tenderness  Throat:   Lips, mucosa, and tongue normal; teeth and gums normal  Neck:   Supple, symmetrical, trachea midline, no adenopathy;       thyroid:  No enlargement/tenderness/nodules; no carotid   bruit  Back:     Symmetric, no curvature, ROM normal, no CVA tenderness  Lungs:     Clear to auscultation bilaterally, respirations unlabored  Chest wall:    No tenderness or deformity  Heart:    Regular rate and rhythm, heart sounds normal, no murmur, rub or gallop  Abdomen:     Soft, non-tender, bowel sounds active all four quadrants,    no masses, no organomegaly.         Extremities:   Extremities normal, atraumatic, no cyanosis or edema  Pulses:   2+ and symmetric all extremities  Skin:   Skin color, texture, turgor normal, no rashes or lesions  Lymph nodes:   Cervical, supraclavicular, and axillary nodes normal  Neurologic:   CNII-XII intact. Normal strength, sensation and reflexes      Throughout. No abnormal coordination or gait. GCS 15 Psychiatric: Normal mood, thought content and judgement.        Assessment & Plan:   Memory loss- intermittent for about 7 months. Also reports slurred speech, peripheral numbness and tingling, grip loss. Normal vital signs and PE today.  CMET, CBC today; MRI brain wo contrast order placed, referral to neurology. Instructions to call 911 for loss of consciousness, facial drooping, weakness in one side of your body, slurred speech. Return in 3 months for follow up of memory loss.

## 2017-01-24 NOTE — Assessment & Plan Note (Signed)
TDAP up to date, influenza vaccine declined. Lipid panel; HIV antibody today. Health maintenance up to date.  Healthy diet and exercise discussed including reduced red meat and 1/2 plate of veggies at meals, getting 150 minutes of exercise per week. Sunscreen, seatbelt, discussed.  Preventive care handout given.

## 2017-01-25 LAB — HIV ANTIBODY (ROUTINE TESTING W REFLEX): HIV: NONREACTIVE

## 2017-02-08 ENCOUNTER — Ambulatory Visit
Admission: RE | Admit: 2017-02-08 | Discharge: 2017-02-08 | Disposition: A | Discharge status: Home / Self Care | Payer: 59 | Source: Ambulatory Visit | Attending: Nurse Practitioner | Admitting: Nurse Practitioner

## 2017-02-08 DIAGNOSIS — R413 Other amnesia: Secondary | ICD-10-CM

## 2017-04-06 ENCOUNTER — Encounter (HOSPITAL_COMMUNITY): Payer: Self-pay | Admitting: Emergency Medicine

## 2017-04-06 ENCOUNTER — Emergency Department (HOSPITAL_COMMUNITY)
Admission: EM | Admit: 2017-04-06 | Discharge: 2017-04-06 | Disposition: A | Discharge status: Home / Self Care | Payer: 59 | Attending: Emergency Medicine | Admitting: Emergency Medicine

## 2017-04-06 ENCOUNTER — Emergency Department (HOSPITAL_COMMUNITY): Payer: 59

## 2017-04-06 DIAGNOSIS — N200 Calculus of kidney: Secondary | ICD-10-CM | POA: Diagnosis not present

## 2017-04-06 DIAGNOSIS — R112 Nausea with vomiting, unspecified: Secondary | ICD-10-CM | POA: Insufficient documentation

## 2017-04-06 DIAGNOSIS — R1031 Right lower quadrant pain: Secondary | ICD-10-CM | POA: Diagnosis present

## 2017-04-06 DIAGNOSIS — R109 Unspecified abdominal pain: Secondary | ICD-10-CM

## 2017-04-06 LAB — LIPASE, BLOOD: LIPASE: 22 U/L (ref 11–51)

## 2017-04-06 LAB — URINALYSIS, ROUTINE W REFLEX MICROSCOPIC
BILIRUBIN URINE: NEGATIVE
Glucose, UA: NEGATIVE mg/dL
KETONES UR: 20 mg/dL — AB
LEUKOCYTES UA: NEGATIVE
NITRITE: NEGATIVE
PROTEIN: 30 mg/dL — AB
Specific Gravity, Urine: 1.02 (ref 1.005–1.030)
pH: 5 (ref 5.0–8.0)

## 2017-04-06 LAB — CBC
HCT: 45.8 % (ref 39.0–52.0)
Hemoglobin: 16.3 g/dL (ref 13.0–17.0)
MCH: 29.9 pg (ref 26.0–34.0)
MCHC: 35.6 g/dL (ref 30.0–36.0)
MCV: 84 fL (ref 78.0–100.0)
Platelets: 304 10*3/uL (ref 150–400)
RBC: 5.45 MIL/uL (ref 4.22–5.81)
RDW: 12.7 % (ref 11.5–15.5)
WBC: 9.5 10*3/uL (ref 4.0–10.5)

## 2017-04-06 LAB — COMPREHENSIVE METABOLIC PANEL
ALT: 106 U/L — ABNORMAL HIGH (ref 17–63)
AST: 262 U/L — ABNORMAL HIGH (ref 15–41)
Albumin: 4.9 g/dL (ref 3.5–5.0)
Alkaline Phosphatase: 81 U/L (ref 38–126)
Anion gap: 10 (ref 5–15)
BUN: 12 mg/dL (ref 6–20)
CO2: 24 mmol/L (ref 22–32)
Calcium: 9.4 mg/dL (ref 8.9–10.3)
Chloride: 103 mmol/L (ref 101–111)
Creatinine, Ser: 0.96 mg/dL (ref 0.61–1.24)
GFR calc Af Amer: >60 mL/min (ref >60)
GFR calc non Af Amer: >60 mL/min (ref >60)
Glucose, Bld: 117 mg/dL — ABNORMAL HIGH (ref 65–99)
Potassium: 3.3 mmol/L — ABNORMAL LOW (ref 3.5–5.1)
Sodium: 137 mmol/L (ref 135–145)
Total Bilirubin: 1 mg/dL (ref 0.3–1.2)
Total Protein: 8.4 g/dL — ABNORMAL HIGH (ref 6.5–8.1)

## 2017-04-06 MED ORDER — ONDANSETRON HCL 4 MG/2ML IJ SOLN
4.0000 mg | Freq: Once | INTRAMUSCULAR | Status: AC | PRN
  Administered 2017-04-06: 4 mg via INTRAVENOUS

## 2017-04-06 MED ORDER — ONDANSETRON HCL 4 MG PO TABS: 4.0000 mg | ORAL_TABLET | Freq: Three times a day (TID) | ORAL | 0 refills | Status: DC | PRN

## 2017-04-06 MED ORDER — OXYCODONE-ACETAMINOPHEN 5-325 MG PO TABS: 1.0000 | ORAL_TABLET | ORAL | 0 refills | Status: DC | PRN

## 2017-04-06 MED ORDER — TAMSULOSIN HCL 0.4 MG PO CAPS: 0.4000 mg | ORAL_CAPSULE | Freq: Every day | ORAL | 0 refills | Status: DC

## 2017-04-06 MED ORDER — SODIUM CHLORIDE 0.9 % IV BOLUS (SEPSIS)
1000.0000 mL | Freq: Once | INTRAVENOUS | Status: AC
Start: 2017-04-06 — End: 2017-04-06
  Administered 2017-04-06: 1000 mL via INTRAVENOUS

## 2017-04-06 MED ORDER — ONDANSETRON HCL 4 MG/2ML IJ SOLN
INTRAMUSCULAR | Status: AC
  Filled 2017-04-06: qty 2

## 2017-04-06 MED ORDER — KETOROLAC TROMETHAMINE 30 MG/ML IJ SOLN
30.0000 mg | Freq: Once | INTRAMUSCULAR | Status: AC
  Administered 2017-04-06: 30 mg via INTRAVENOUS
  Filled 2017-04-06: qty 1

## 2017-04-06 MED ORDER — HYDROMORPHONE HCL 1 MG/ML IJ SOLN
1.0000 mg | Freq: Once | INTRAMUSCULAR | Status: AC
  Administered 2017-04-06: 1 mg via INTRAVENOUS
  Filled 2017-04-06: qty 1

## 2017-04-06 MED ORDER — OXYCODONE-ACETAMINOPHEN 5-325 MG PO TABS
1.0000 | ORAL_TABLET | Freq: Once | ORAL | Status: AC
  Administered 2017-04-06: 1 via ORAL
  Filled 2017-04-06: qty 1

## 2017-04-06 MED ORDER — PROMETHAZINE HCL 25 MG/ML IJ SOLN
25.0000 mg | Freq: Once | INTRAMUSCULAR | Status: AC
  Administered 2017-04-06: 25 mg via INTRAVENOUS
  Filled 2017-04-06: qty 1

## 2017-04-06 NOTE — Discharge Instructions (Signed)
Your workup showed evidence of kidney stone.  Please use the pain medicine, nausea medicine, and Flomax to help with this.  Please follow-up with your PCP and urology.  Please stay hydrated.  If any symptoms change or worsen, please return to the nearest emergency department.

## 2017-04-06 NOTE — ED Provider Notes (Signed)
[facility] DEPT Provider Note   CSN: [id number] Arrival date & time: 04/06/17  1638     History   Chief Complaint Chief Complaint  Patient presents with  . Flank Pain    HPI Danny Zimmerman is a 47 y.o. male.  The history is provided by the patient, the spouse and medical records. No language interpreter was used.  Flank Pain  This is a recurrent problem. The current episode started 6 to 12 hours ago. The problem occurs constantly. The problem has not changed since onset.Associated symptoms include abdominal pain. Pertinent negatives include no chest pain, no headaches and no shortness of breath. Nothing aggravates the symptoms. Nothing relieves the symptoms. He has tried nothing for the symptoms. The treatment provided no relief.    Past Medical History:  Diagnosis Date  . GERD (gastroesophageal reflux disease)   . Ureteral stone     Patient Active Problem List   Diagnosis Date Noted  . Encounter for general adult medical examination with abnormal findings 01/24/2017    Past Surgical History:  Procedure Laterality Date  . CYSTOSCOPY/RETROGRADE/URETEROSCOPY/STONE EXTRACTION WITH BASKET Left 02/01/2015   Procedure: CYSTOSCOPY/LEFT RETROGRADE/LEFT URETERAL BALLON DILITATION/LEFT /URETEROSCOPY/STONE EXTRACTION WITH BASKET LEFT URETERAL STENT;  Surgeon: Carolan Clines, MD;  Location: [facility];  Service: Urology;  Laterality: Left;       Home Medications    Prior to Admission medications   Not on File    Family History Family History  Problem Relation Age of Onset  . Colon cancer Neg Hx   . Esophageal cancer Neg Hx   . Stomach cancer Neg Hx     Social History Social History   Tobacco Use  . Smoking status: Never Smoker  . Smokeless tobacco: Never Used  Substance Use Topics  . Alcohol use: Yes    Comment: socially  . Drug use: No     Allergies   Patient has no known allergies.   Review of Systems Review of Systems    Constitutional: Negative for chills, diaphoresis, fatigue and fever.  HENT: Negative for congestion.   Eyes: Negative for visual disturbance.  Respiratory: Negative for cough, choking, chest tightness, shortness of breath and stridor.   Cardiovascular: Negative for chest pain.  Gastrointestinal: Positive for abdominal pain, nausea and vomiting. Negative for constipation and diarrhea.  Genitourinary: Positive for decreased urine volume and flank pain. Negative for difficulty urinating, enuresis, frequency, genital sores and hematuria.  Musculoskeletal: Negative for back pain, gait problem and neck pain.  Skin: Negative for rash.  Neurological: Negative for light-headedness and headaches.  Psychiatric/Behavioral: Negative for agitation.  All other systems reviewed and are negative.    Physical Exam Updated Vital Signs BP (!) 166/98 (BP Location: Left Arm)   Pulse 71   Temp 98 F (36.7 C) (Oral)   Resp (!) 22   SpO2 98%   Physical Exam  Constitutional: He is oriented to person, place, and time. He appears well-developed and well-nourished. He appears distressed.  HENT:  Head: Normocephalic.  Mouth/Throat: Oropharynx is clear and moist. No oropharyngeal exudate.  Eyes: Conjunctivae and EOM are normal. Pupils are equal, round, and reactive to light.  Neck: Normal range of motion.  Cardiovascular: Intact distal pulses. Tachycardia present.  No murmur heard. Pulmonary/Chest: Effort normal. No stridor. No respiratory distress. He has no wheezes. He has no rales. He exhibits no tenderness.  Abdominal: Soft. Normal appearance. He exhibits no distension. There is tenderness. There is CVA tenderness. There is no rigidity, no  rebound and no guarding.    Musculoskeletal: He exhibits tenderness. He exhibits no edema.       Back:  Neurological: He is alert and oriented to person, place, and time. No sensory deficit. He exhibits normal muscle tone.  Skin: He is not diaphoretic.  Nursing  note and vitals reviewed.    ED Treatments / Results  Labs (all labs ordered are listed, but only abnormal results are displayed) Labs Reviewed  COMPREHENSIVE METABOLIC PANEL - Abnormal; Notable for the following components:      Result Value   Potassium 3.3 (*)    Glucose, Bld 117 (*)    Total Protein 8.4 (*)    AST 262 (*)    ALT 106 (*)    All other components within normal limits  URINALYSIS, ROUTINE W REFLEX MICROSCOPIC - Abnormal; Notable for the following components:   Hgb urine dipstick MODERATE (*)    Ketones, ur 20 (*)    Protein, ur 30 (*)    Bacteria, UA RARE (*)    Squamous Epithelial / LPF 0-5 (*)    All other components within normal limits  URINE CULTURE  LIPASE, BLOOD  CBC    EKG  EKG Interpretation None       Radiology Ct Renal Stone Study  Result Date: 04/06/2017 CLINICAL DATA:  Right abdominal pain that started earlier today. EXAM: CT ABDOMEN AND PELVIS WITHOUT CONTRAST TECHNIQUE: Multidetector CT imaging of the abdomen and pelvis was performed following the standard protocol without IV contrast. COMPARISON:  01/30/2015 [facility]. FINDINGS: Lower chest: Unremarkable. Hepatobiliary: No focal abnormality in the liver on this study without intravenous contrast. There is no evidence for gallstones, gallbladder wall thickening, or pericholecystic fluid. No intrahepatic or extrahepatic biliary dilation. Pancreas: No focal mass lesion. No dilatation of the main duct. No intraparenchymal cyst. No peripancreatic edema. Spleen: No splenomegaly. No focal mass lesion. Adrenals/Urinary Tract: No adrenal nodule or mass. 12 mm water density lesion lower pole right kidney is stable and compatible with a cyst. Adjacent nonobstructing stones in the upper pole the right kidney each measure about 2 mm. There is mild fullness of the right intrarenal collecting system and ureter. 2 x 3 mm stone is identified at the distal right ureter, just proximal to the UVJ. Left kidney  unremarkable. No left renal or ureteral stone. No bladder stone. Stomach/Bowel: Stomach is nondistended. No gastric wall thickening. No evidence of outlet obstruction. Duodenum is normally positioned as is the ligament of Treitz. No small bowel wall thickening. No small bowel dilatation. The terminal ileum is normal. The appendix is normal. No gross colonic mass. No colonic wall thickening. No substantial diverticular change. Vascular/Lymphatic: No abdominal aortic aneurysm. No abdominal aortic atherosclerotic calcification. There is no gastrohepatic or hepatoduodenal ligament lymphadenopathy. No intraperitoneal or retroperitoneal lymphadenopathy. No pelvic sidewall lymphadenopathy. Reproductive: The prostate gland and seminal vesicles have normal imaging features. Other: No intraperitoneal free fluid. Musculoskeletal: Bone windows reveal no worrisome lytic or sclerotic osseous lesions. IMPRESSION: 1. 2 x 3 mm distal right ureteral stone, just proximal to the UVJ. Mild associated right hydroureteronephrosis. 2. Adjacent tiny nonobstructing stones upper pole right kidney. Electronically Signed   By: Misty Stanley M.D.   On: 04/06/2017 18:32    Procedures Procedures (including critical care time)  Medications Ordered in ED Medications  ondansetron (ZOFRAN) injection 4 mg (4 mg Intravenous Given 04/06/17 1721)  HYDROmorphone (DILAUDID) injection 1 mg (1 mg Intravenous Given 04/06/17 1801)  sodium chloride 0.9 % bolus 1,000 mL (0  mLs Intravenous Stopped 04/06/17 1905)  promethazine (PHENERGAN) injection 25 mg (25 mg Intravenous Given 04/06/17 1817)  ketorolac (TORADOL) 30 MG/ML injection 30 mg (30 mg Intravenous Given 04/06/17 1859)  oxyCODONE-acetaminophen (PERCOCET/ROXICET) 5-325 MG per tablet 1 tablet (1 tablet Oral Given 04/06/17 2320)     Initial Impression / Assessment and Plan / ED Course  I have reviewed the triage vital signs and the nursing notes.  Pertinent labs & imaging results that were  available during my care of the patient were reviewed by me and considered in my medical decision making (see chart for details).     Danny Zimmerman is a 47 y.o. male with a past medical history of kidney stone requiring urologic intervention who presents with sudden onset of right flank pain, nausea, and vomiting.  Patient reports that he was feeling normal until today department 1:30 PM when he had sudden onset of right flank pain.  He describes it as feeling similar to prior kidney stone.  He describes it as a sharp pain in his right upper back and right flank.  He reports that he has had decreased urination since onset.  He denies recent dysuria, or fevers but does report some mild chills.  He reports he has had nausea and vomiting with dry heaving.  He denies any recent trauma.  He has not taken any medicine for his symptoms.  He denies any congestion, cough, chest pain, shortness of breath, or any GI symptoms of constipation or diarrhea.  On exam, patient is in severe discomfort in his moving on the bed.  Patient cannot get comfortable.  Patient has tenderness in his right CVA area and right flank.  No significant abdominal tenderness.  Patient deferred GU exam and denied any testicle pain.  Lungs were clear and chest was nontender.  Patient describes the pain as 11 out of 10 in severity.  Given patient's history of kidney stone requiring extraction due to with the family described as "spiky stones" patient will have CT imaging to look for obstruction.  Patient will have laboratory testing including labs patient was given pain medicine, nausea medicine, and fluids.  Initially, Zofran and did not help his nausea.  A discussion was held with patient about IV Phenergan and vascular damage however family agreed to use this medication to help with his severe nausea.  Anticipate reassessment after workup.  Diagnostic testing results seen above.  Urinalysis showed hematuria but no evidence of nitrites or  leukocytes.  Doubt infection.  Lipase not elevated, CBC reassuring.  Overall reassuring.  AST and ALT were however elevated, I feel this is likely due to the nausea vomiting and severe discomfort.  CT imaging showed evidence of a small 2 x 3 mm stone in the distal right ureter near the UVJ.  Suspect this is the cause of the patient's symptoms.  No evidence of gallbladder disease on CT.  Doubt patient has acute cholecystitis as well as the kidney stone.  Patient began feeling much better after the pain medicine, nausea medicine, and fluids.  Patient was able to rest and sleep.  Patient was pain-free.  Suspect patient may have either passed a stone or is very close to doing so.  Patient was felt stable for discharge home with pain medicine, not will follow up with his urologist and PCP.  Patient and family had no other questions or concerns and patient was discharged in good condition with significant improvement in pain, vital signs, and symptoms.   Final Clinical Impressions(s) /  ED Diagnoses   Final diagnoses:  Right flank pain  Kidney stone  Non-intractable vomiting with nausea, unspecified vomiting type    ED Discharge Orders        Ordered    oxyCODONE-acetaminophen (PERCOCET/ROXICET) 5-325 MG tablet  Every 4 hours PRN     04/06/17 2233    ondansetron (ZOFRAN) 4 MG tablet  Every 8 hours PRN     04/06/17 2233    tamsulosin (FLOMAX) 0.4 MG CAPS capsule  Daily     04/06/17 2233     Clinical Impression: 1. Right flank pain   2. Kidney stone   3. Non-intractable vomiting with nausea, unspecified vomiting type     Disposition: Discharge  Condition: Good  I have discussed the results, Dx and Tx plan with the pt(& family if present). He/she/they expressed understanding and agree(s) with the plan. Discharge instructions discussed at great length. Strict return precautions discussed and pt &/or family have verbalized understanding of the instructions. No further questions at time of  discharge.    Discharge Medication List as of 04/06/2017 10:34 PM    START taking these medications   Details  ondansetron (ZOFRAN) 4 MG tablet Take 1 tablet (4 mg total) by mouth every 8 (eight) hours as needed for nausea or vomiting., Starting Sat 04/06/2017, Print    oxyCODONE-acetaminophen (PERCOCET/ROXICET) 5-325 MG tablet Take 1 tablet by mouth every 4 (four) hours as needed for severe pain., Starting Sat 04/06/2017, Print    tamsulosin (FLOMAX) 0.4 MG CAPS capsule Take 1 capsule (0.4 mg total) by mouth daily., Starting Sat 04/06/2017, Print        Follow Up: Lance Sell, NP [place] [address] [phone]     [facility] [place] [id number] [place]       Jahmel Flannagan, Gwenyth Allegra, MD 04/07/17 [phone]

## 2017-04-06 NOTE — ED Notes (Signed)
He is just back from CT; and IV fluid warmer added with fluid temp. Of 43C. He is awake and answering questions appropriately. His only c/o is of being "cold". BAIR hugger re-applied at this time also.

## 2017-04-06 NOTE — ED Notes (Signed)
Made Tegeler aware that patient still vomiting and if we can get something else because Zofran didn't help.

## 2017-04-06 NOTE — ED Triage Notes (Signed)
Patient c/o right abd side pain that started around 130pm today. Patient has PMH kidney stones. Patient has had some urine today without any blood, denies pain with urination. Patient has vomiting, no diarrhea

## 2017-04-08 LAB — URINE CULTURE: Culture: <10000 — AB

## 2017-04-12 ENCOUNTER — Other Ambulatory Visit: Payer: Self-pay

## 2017-04-12 ENCOUNTER — Encounter: Payer: Self-pay | Admitting: Neurology

## 2017-04-12 ENCOUNTER — Other Ambulatory Visit: Payer: 59

## 2017-04-12 ENCOUNTER — Ambulatory Visit: Payer: 59 | Admitting: Neurology

## 2017-04-12 VITALS — BP 126/84 | HR 72 | Resp 10 | Ht 65.0 in | Wt 179.0 lb

## 2017-04-12 DIAGNOSIS — R413 Other amnesia: Secondary | ICD-10-CM | POA: Diagnosis not present

## 2017-04-12 DIAGNOSIS — R200 Anesthesia of skin: Secondary | ICD-10-CM | POA: Diagnosis not present

## 2017-04-12 LAB — TSH: TSH: 0.63 m[IU]/L (ref 0.40–4.50)

## 2017-04-12 LAB — VITAMIN B12: Vitamin B-12: 379 pg/mL (ref 200–1100)

## 2017-04-12 NOTE — Progress Notes (Signed)
NEUROLOGY CONSULTATION NOTE  Danny Zimmerman MRN: [id number] DOB: April 01, 1970  Referring provider: Caesar Chestnut, NP Primary care provider: Caesar Chestnut, NP  Reason for consult:  Memory loss, numbness  Thank you for your kind referral of Danny Zimmerman for consultation of the above symptoms. Although his history is well known to you, please allow me to reiterate it for the purpose of our medical record. Records and images were personally reviewed where available.   HISTORY OF PRESENT ILLNESS: This is a very pleasant 46 year old right-handed man with a history of GERD, ureteral stone, presenting for evaluation of memory loss and numbness. He started noticing memory changes around 7-8 months ago with difficulty remembering names and tasks. Co-workers he has know for many years would ask him how he could forget their names. He would plan to do a task, then 5-10 minutes later could not recall what he was supposed to do. 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 He denies getting lost driving. No missed bills or medications. He misplaces things frequently, his wife has to repeat things to him. There is no family history of dementia, no history of significant head injuries. He drinks alcohol socially. He started noticing numbness in both hands around a year ago, worse on the right hand. His has numbness up to the wrists, mainly affecting the middle 3 fingers. Numbness and pins/needles sensation comes and goes, more in the morning and night hours. Legs are unaffected. He occasional drops things from both hands. He denies any neck/back pain. He has occasional headaches, rare dizziness upon standing, no diplopia, dysarthria, bowel/bladder dysfunction. He has swallowing difficulties due to reflux. He had an MRI brain without contrast on 02/08/17 which I personally reviewed, normal.    PAST MEDICAL HISTORY: Past Medical History:  Diagnosis Date  . GERD  (gastroesophageal reflux disease)   . Ureteral stone     PAST SURGICAL HISTORY: Past Surgical History:  Procedure Laterality Date  . CYSTOSCOPY/RETROGRADE/URETEROSCOPY/STONE EXTRACTION WITH BASKET Left 02/01/2015   Procedure: CYSTOSCOPY/LEFT RETROGRADE/LEFT URETERAL BALLON DILITATION/LEFT /URETEROSCOPY/STONE EXTRACTION WITH BASKET LEFT URETERAL STENT;  Surgeon: Carolan Clines, MD;  Location: [facility];  Service: Urology;  Laterality: Left;    MEDICATIONS: Current Outpatient Medications on File Prior to Visit  Medication Sig Dispense Refill  . ibuprofen (ADVIL,MOTRIN) 200 MG tablet Take 400 mg by mouth every 6 (six) hours as needed for moderate pain.    Marland Kitchen ondansetron (ZOFRAN) 4 MG tablet Take 1 tablet (4 mg total) by mouth every 8 (eight) hours as needed for nausea or vomiting. 12 tablet 0  . oxyCODONE-acetaminophen (PERCOCET/ROXICET) 5-325 MG tablet Take 1 tablet by mouth every 4 (four) hours as needed for severe pain. 15 tablet 0  . tamsulosin (FLOMAX) 0.4 MG CAPS capsule Take 1 capsule (0.4 mg total) by mouth daily. 7 capsule 0   No current facility-administered medications on file prior to visit.     ALLERGIES: No Known Allergies  FAMILY HISTORY: Family History  Problem Relation Age of Onset  . Colon cancer Neg Hx   . Esophageal cancer Neg Hx   . Stomach cancer Neg Hx     SOCIAL HISTORY: Social History   Socioeconomic History  . Marital status: Married    Spouse name: Not on file  . Number of children: 3  . Years of education: Not on file  . Highest education level: Not on file  Social Needs  . Financial resource strain: Not on file  .  Food insecurity - worry: Not on file  . Food insecurity - inability: Not on file  . Transportation needs - medical: No  . Transportation needs - non-medical: No  Occupational History  . Occupation: Therapist, nutritional    Comment: [employer]  Tobacco Use  . Smoking status: Never Smoker  . Smokeless tobacco: Never Used  Substance  and Sexual Activity  . Alcohol use: Yes    Comment: socially  . Drug use: No  . Sexual activity: Yes    Partners: Female  Other Topics Concern  . Not on file  Social History Narrative   Fun: time with family    REVIEW OF SYSTEMS: Constitutional: No fevers, chills, or sweats, no generalized fatigue, change in appetite Eyes: No visual changes, double vision, eye pain Ear, nose and throat: No hearing loss, ear pain, nasal congestion, sore throat Cardiovascular: No chest pain, palpitations Respiratory:  No shortness of breath at rest or with exertion, wheezes GastrointestinaI: No nausea, vomiting, diarrhea, abdominal pain, fecal incontinence Genitourinary:  No dysuria, urinary retention or frequency Musculoskeletal:  No neck pain, back pain Integumentary: No rash, pruritus, skin lesions Neurological: as above Psychiatric: No depression, insomnia, anxiety Endocrine: No palpitations, fatigue, diaphoresis, mood swings, change in appetite, change in weight, increased thirst Hematologic/Lymphatic:  No anemia, purpura, petechiae. Allergic/Immunologic: no itchy/runny eyes, nasal congestion, recent allergic reactions, rashes  PHYSICAL EXAM: Vitals:   04/12/17 0906  BP: 126/84  Pulse: 72  Resp: 10   General: No acute distress Head:  Normocephalic/atraumatic Eyes: Fundoscopic exam shows bilateral sharp discs, no vessel changes, exudates, or hemorrhages Neck: supple, no paraspinal tenderness, full range of motion Back: No paraspinal tenderness Heart: regular rate and rhythm Lungs: Clear to auscultation bilaterally. Vascular: No carotid bruits. Skin/Extremities: No rash, no edema Neurological Exam: Mental status: alert and oriented to person, place, and time, no dysarthria or aphasia, Fund of knowledge is appropriate.  Recent and remote memory are intact.  Attention and concentration are normal.    Able to name objects and repeat phrases.  Montreal Cognitive Assessment  04/12/2017    Visuospatial/ Executive (0/5) 5  Naming (0/3) 3  Attention: Read list of digits (0/2) 2  Attention: Read list of letters (0/1) 1  Attention: Serial 7 subtraction starting at 100 (0/3) 3  Language: Repeat phrase (0/2) 2  Language : Fluency (0/1) 1  Abstraction (0/2) 2  Delayed Recall (0/5) 4  Orientation (0/6) 6  Total 29   Cranial nerves: CN I: not tested CN II: pupils equal, round and reactive to light, visual fields intact, fundi unremarkable. CN III, IV, VI:  full range of motion, no nystagmus, no ptosis CN V: facial sensation intact CN VII: upper and lower face symmetric CN VIII: hearing intact to finger rub CN IX, X: gag intact, uvula midline CN XI: sternocleidomastoid and trapezius muscles intact CN XII: tongue midline Bulk & Tone: normal, no fasciculations. Motor: 5/5 throughout, including bilateral APBs. No pronator drift, good fine finger movements Sensation: intact to light touch, cold, pin, vibration and joint position sense.  No extinction to double simultaneous stimulation.  Romberg test negative Deep Tendon Reflexes: +2 throughout, no ankle clonus Plantar responses: downgoing bilaterally Cerebellar: no incoordination on finger to nose, heel to shin. No dysdiadochokinesia Gait: narrow-based and steady, able to tandem walk adequately. Tremor: none Negative Tinel sign at wrist and elbow, negative Phalen sign  IMPRESSION: This is a very pleasant 47 year old right-handed man with a history of GERD, ureteral stone, presenting for  evaluation of memory loss and bilateral hand numbness, occasionally dropping things. His neurological exam is normal, MOCA score normal 29/30. MRI brain normal. We discussed different causes of memory loss. Check TSH and B12. He will be scheduled for Neurocognitive testing to further evaluate memory complaints. Hand symptoms suggestive of carpal tunnel syndrome, he was advised to start wearing bilateral wrist splints. He will be scheduled for an  EMG/NCV of both UE. He will follow-up in 6 months and knows to call for any changes.   Thank you for allowing me to participate in the care of this patient. Please do not hesitate to call for any questions or concerns.   Ellouise Newer, M.D.  CC: Caesar Chestnut, NP

## 2017-04-12 NOTE — Patient Instructions (Addendum)
1. Bloodwork for TSH, B12  Your provider has requested that you have labwork completed today. Please go to [facility] ([id number]) on the second floor of this building before leaving the office today. You do not need to check in. If you are not called within 15 minutes please check with the front desk.   2. Schedule EMG/NCV of both UE with Dr. Posey Pronto 3. Start using wrist splints during the daytime 4. Follow-up in 6 months, call for any changes

## 2017-04-21 ENCOUNTER — Encounter: Payer: Self-pay | Admitting: Neurology

## 2017-05-02 ENCOUNTER — Ambulatory Visit (INDEPENDENT_AMBULATORY_CARE_PROVIDER_SITE_OTHER): Payer: 59 | Admitting: Neurology

## 2017-05-02 DIAGNOSIS — G5621 Lesion of ulnar nerve, right upper limb: Secondary | ICD-10-CM

## 2017-05-02 DIAGNOSIS — G5603 Carpal tunnel syndrome, bilateral upper limbs: Secondary | ICD-10-CM

## 2017-05-02 DIAGNOSIS — R413 Other amnesia: Secondary | ICD-10-CM

## 2017-05-02 NOTE — Procedures (Signed)
[facility]  [address], [place]  [place], [place] [address] Tel: [phone] Fax:  [phone] Test Date:  05/02/2017  Patient: Danny Zimmerman DOB: 1971-03-06 Physician: Narda Amber, DO  Sex: Male Height: 5\' 5"  Ref Phys: Ellouise Newer, MD  ID#: [id number] Temp: 37.2C Technician:    Patient Complaints: This is a 47 year old man referred for evaluation of bilateral hand numbness, worse on the right.  NCV & EMG Findings: Extensive electrodiagnostic testing of the right upper extremity and additional studies of the left shows:  1. Bilateral median sensory responses show prolonged latency (R3.7, L3.5 ms) and normal amplitude. Right ulnar sensory response shows reduced amplitude (8.3 V) and normal latency. Left ulnar sensory response is within normal limits. 2. Bilateral median and left ulnar motor responses are within normal limits. Right ulnar motor response shows slowed conduction velocity across the elbow. 3. Rapid recruitment pattern is isolated to the right abductor digit minimi muscle, with normal motor unit configuration. There is no evidence of active denervation involving any of the tested muscles.   Impression: 1. Right ulnar neuropathy with slowing across the elbow, demyelinating and axon loss in type; mild-to-moderate in degree electrically. 2. Bilateral median neuropathy at or distal to the wrist, consistent with the clinical diagnosis of carpal tunnel syndrome. Overall, these findings are mild in degree electrically.   ___________________________ Narda Amber, DO    Nerve Conduction Studies Anti Sensory Summary Table   Site NR Peak (ms) Norm Peak (ms) P-T Amp (V) Norm P-T Amp  Left Median Anti Sensory (2nd Digit)  37.2C  Wrist    3.5 <3.4 29.7 >20  Right Median Anti Sensory (2nd Digit)  37.2C  Wrist    3.7 <3.4 23.5 >20  Left Ulnar Anti Sensory (5th Digit)  37.2C  Wrist    2.5 <3.1 26.9 >12  Right Ulnar Anti Sensory (5th Digit)  37.2C  Wrist     2.3 <3.1 8.3 >12   Motor Summary Table   Site NR Onset (ms) Norm Onset (ms) O-P Amp (mV) Norm O-P Amp Site1 Site2 Delta-0 (ms) Dist (cm) Vel (m/s) Norm Vel (m/s)  Left Median Motor (Abd Poll Brev)  37.2C  Wrist    3.4 <3.9 12.3 >6 Elbow Wrist 4.8 28.0 58 >50  Elbow    8.2  11.2         Right Median Motor (Abd Poll Brev)  37.2C  Wrist    3.8 <3.9 11.3 >6 Elbow Wrist 4.7 29.0 62 >50  Elbow    8.5  11.3         Left Ulnar Motor (Abd Dig Minimi)  37.2C  Wrist    1.4 <3.1 7.6 >7 B Elbow Wrist 3.8 23.0 61 >50  B Elbow    5.2  6.9  A Elbow B Elbow 1.6 10.0 63 >50  A Elbow    6.8  6.6         Right Ulnar Motor (Abd Dig Minimi)  37.2C  Wrist    1.7 <3.1 8.1 >7 B Elbow Wrist 3.2 23.0 72 >50  B Elbow    4.9  7.6  A Elbow B Elbow 2.2 10.0 45 >50  A Elbow    7.1  7.4         Right Ulnar (FDI) Motor (1st DI)  37.2C  Wrist    3.6 <4.3 14.0 >7 B Elbow Wrist 3.4 23.0 68 >50  B Elbow    7.0  13.2  A Elbow B Elbow  2.1 10.0 48 >50  A Elbow    9.1  12.8          EMG   Side Muscle Ins Act Fibs Psw Fasc Number Recrt Dur Dur. Amp Amp. Poly Poly. Comment  Right 1stDorInt Nml Nml Nml Nml Nml Nml Nml Nml Nml Nml Nml Nml N/A  Right Abd Poll Brev Nml Nml Nml Nml Nml Nml Nml Nml Nml Nml Nml Nml N/A  Right ABD Dig Min Nml Nml Nml Nml 1- Rapid Nml Nml Nml Nml Nml Nml N/A  Right PronatorTeres Nml Nml Nml Nml Nml Nml Nml Nml Nml Nml Nml Nml N/A  Right Biceps Nml Nml Nml Nml Nml Nml Nml Nml Nml Nml Nml Nml N/A  Right Triceps Nml Nml Nml Nml Nml Nml Nml Nml Nml Nml Nml Nml N/A  Right Deltoid Nml Nml Nml Nml Nml Nml Nml Nml Nml Nml Nml Nml N/A  Right FlexCarpiUln Nml Nml Nml Nml Nml Nml Nml Nml Nml Nml Nml Nml N/A  Left 1stDorInt Nml Nml Nml Nml Nml Nml Nml Nml Nml Nml Nml Nml N/A  Left Abd Poll Brev Nml Nml Nml Nml Nml Nml Nml Nml Nml Nml Nml Nml N/A  Left PronatorTeres Nml Nml Nml Nml Nml Nml Nml Nml Nml Nml Nml Nml N/A      Waveforms:

## 2017-05-07 ENCOUNTER — Telehealth: Payer: Self-pay | Admitting: Neurology

## 2017-05-07 NOTE — Telephone Encounter (Signed)
Pt wanted results of his testing with Dr Posey Pronto [phone]

## 2017-05-08 NOTE — Telephone Encounter (Signed)
Pls let him know the nerve test confirmed carpal tunnel syndrome on both wrists, as well as pinched nerve at the right elbow. For the carpal tunnel syndrome, wrist splints are still the first recommendation. For the pinched nerve at the elbow, there is also an elbow support he can buy to avoid further compression on the nerve, but also the main thing is to avoid putting pressure on his elbow. If symptoms continue despite these measures, we usually refer to a hand surgeon and they may offer surgery to relieve the pressure on the nerve. Thanks

## 2017-05-08 NOTE — Telephone Encounter (Signed)
Attempted to contact pt.  CB# was to pt's wife, pt's wife gave me pt's work number.  It was the main phone number to [facility] [facility] [employer] branch.  Unable to speak with pt.  Called listed home/mobile number.  No answer.  No voicemail.  Unable to relay results to pt at this time.

## 2017-10-15 ENCOUNTER — Ambulatory Visit: Payer: 59 | Admitting: Neurology

## 2018-01-30 ENCOUNTER — Other Ambulatory Visit (INDEPENDENT_AMBULATORY_CARE_PROVIDER_SITE_OTHER): Payer: 59

## 2018-01-30 ENCOUNTER — Encounter: Payer: Self-pay | Admitting: Nurse Practitioner

## 2018-01-30 ENCOUNTER — Ambulatory Visit (INDEPENDENT_AMBULATORY_CARE_PROVIDER_SITE_OTHER)
Admission: RE | Admit: 2018-01-30 | Discharge: 2018-01-30 | Disposition: A | Discharge status: Home / Self Care | Payer: 59 | Source: Ambulatory Visit | Attending: Nurse Practitioner | Admitting: Nurse Practitioner

## 2018-01-30 ENCOUNTER — Ambulatory Visit
Admission: RE | Admit: 2018-01-30 | Discharge: 2018-01-30 | Disposition: A | Discharge status: Home / Self Care | Payer: 59 | Source: Ambulatory Visit | Attending: Nurse Practitioner | Admitting: Nurse Practitioner

## 2018-01-30 ENCOUNTER — Ambulatory Visit (INDEPENDENT_AMBULATORY_CARE_PROVIDER_SITE_OTHER): Payer: 59 | Admitting: Nurse Practitioner

## 2018-01-30 VITALS — BP 120/82 | HR 73 | Ht 65.0 in | Wt 169.0 lb

## 2018-01-30 DIAGNOSIS — R7309 Other abnormal glucose: Secondary | ICD-10-CM | POA: Diagnosis not present

## 2018-01-30 DIAGNOSIS — M25512 Pain in left shoulder: Secondary | ICD-10-CM

## 2018-01-30 DIAGNOSIS — Z23 Encounter for immunization: Secondary | ICD-10-CM

## 2018-01-30 DIAGNOSIS — Z0001 Encounter for general adult medical examination with abnormal findings: Secondary | ICD-10-CM

## 2018-01-30 DIAGNOSIS — Z125 Encounter for screening for malignant neoplasm of prostate: Secondary | ICD-10-CM

## 2018-01-30 DIAGNOSIS — M25511 Pain in right shoulder: Secondary | ICD-10-CM

## 2018-01-30 DIAGNOSIS — N529 Male erectile dysfunction, unspecified: Secondary | ICD-10-CM

## 2018-01-30 DIAGNOSIS — Z13220 Encounter for screening for lipoid disorders: Secondary | ICD-10-CM

## 2018-01-30 LAB — COMPREHENSIVE METABOLIC PANEL
ALK PHOS: 67 U/L (ref 39–117)
ALT: 24 U/L (ref 0–53)
AST: 20 U/L (ref 0–37)
Albumin: 4.8 g/dL (ref 3.5–5.2)
BILIRUBIN TOTAL: 1.3 mg/dL — AB (ref 0.2–1.2)
BUN: 9 mg/dL (ref 6–23)
CO2: 28 meq/L (ref 19–32)
CREATININE: 0.86 mg/dL (ref 0.40–1.50)
Calcium: 9.8 mg/dL (ref 8.4–10.5)
Chloride: 103 mEq/L (ref 96–112)
GFR: 102.23 mL/min (ref >60.00)
Glucose, Bld: 92 mg/dL (ref 70–99)
Potassium: 4.4 mEq/L (ref 3.5–5.1)
SODIUM: 140 meq/L (ref 135–145)
TOTAL PROTEIN: 7.8 g/dL (ref 6.0–8.3)

## 2018-01-30 LAB — CBC
HCT: 48.4 % (ref 39.0–52.0)
Hemoglobin: 16.5 g/dL (ref 13.0–17.0)
MCHC: 34.2 g/dL (ref 30.0–36.0)
MCV: 85.7 fl (ref 78.0–100.0)
Platelets: 305 10*3/uL (ref 150.0–400.0)
RBC: 5.65 Mil/uL (ref 4.22–5.81)
RDW: 12.7 % (ref 11.5–15.5)
WBC: 5.3 10*3/uL (ref 4.0–10.5)

## 2018-01-30 LAB — LIPID PANEL
Cholesterol: 208 mg/dL — ABNORMAL HIGH (ref 0–200)
HDL: 51.7 mg/dL (ref >39.00)
LDL Cholesterol: 127 mg/dL — ABNORMAL HIGH (ref 0–99)
NonHDL: 156.01
Total CHOL/HDL Ratio: 4
Triglycerides: 145 mg/dL (ref 0.0–149.0)
VLDL: 29 mg/dL (ref 0.0–40.0)

## 2018-01-30 LAB — PSA: PSA: 0.99 ng/mL (ref 0.10–4.00)

## 2018-01-30 LAB — TESTOSTERONE: TESTOSTERONE: 431.95 ng/dL (ref 300.00–890.00)

## 2018-01-30 LAB — TSH: TSH: 1.15 u[IU]/mL (ref 0.35–4.50)

## 2018-01-30 LAB — HEMOGLOBIN A1C: HEMOGLOBIN A1C: 5.8 % (ref 4.6–6.5)

## 2018-01-30 NOTE — Patient Instructions (Signed)
Head downstairs for labs and xrays  I will see you back in 1 year, sooner if needed.   Health Maintenance, Male A healthy lifestyle and preventive care is important for your health and wellness. Ask your health care provider about what schedule of regular examinations is right for you. What should I know about weight and diet? Eat a Healthy Diet  Eat plenty of vegetables, fruits, whole grains, low-fat dairy products, and lean protein.  Do not eat a lot of foods high in solid fats, added sugars, or salt.  Maintain a Healthy Weight Regular exercise can help you achieve or maintain a healthy weight. You should:  Do at least 150 minutes of exercise each week. The exercise should increase your heart rate and make you sweat (moderate-intensity exercise).  Do strength-training exercises at least twice a week.  Watch Your Levels of Cholesterol and Blood Lipids  Have your blood tested for lipids and cholesterol every 5 years starting at 45 years of age. If you are at high risk for heart disease, you should start having your blood tested when you are 45 years old. You may need to have your cholesterol levels checked more often if: ? Your lipid or cholesterol levels are high. ? You are older than 45 years of age. ? You are at high risk for heart disease.  What should I know about cancer screening? Many types of cancers can be detected early and may often be prevented. Lung Cancer  You should be screened every year for lung cancer if: ? You are a current smoker who has smoked for at least 30 years. ? You are a former smoker who has quit within the past 15 years.  Talk to your health care provider about your screening options, when you should start screening, and how often you should be screened.  Colorectal Cancer  Routine colorectal cancer screening usually begins at 45 years of age and should be repeated every 5-10 years until you are 45 years old. You may need to be screened more often  if early forms of precancerous polyps or small growths are found. Your health care provider may recommend screening at an earlier age if you have risk factors for colon cancer.  Your health care provider may recommend using home test kits to check for hidden blood in the stool.  A small camera at the end of a tube can be used to examine your colon (sigmoidoscopy or colonoscopy). This checks for the earliest forms of colorectal cancer.  Prostate and Testicular Cancer  Depending on your age and overall health, your health care provider may do certain tests to screen for prostate and testicular cancer.  Talk to your health care provider about any symptoms or concerns you have about testicular or prostate cancer.  Skin Cancer  Check your skin from head to toe regularly.  Tell your health care provider about any new moles or changes in moles, especially if: ? There is a change in a mole's size, shape, or color. ? You have a mole that is larger than a pencil eraser.  Always use sunscreen. Apply sunscreen liberally and repeat throughout the day.  Protect yourself by wearing long sleeves, pants, a wide-brimmed hat, and sunglasses when outside.  What should I know about heart disease, diabetes, and high blood pressure?  If you are 45-22 years of age, have your blood pressure checked every 3-5 years. If you are 45 years of age or older, have your blood pressure checked every  year. You should have your blood pressure measured twice-once when you are at a hospital or clinic, and once when you are not at a hospital or clinic. Record the average of the two measurements. To check your blood pressure when you are not at a hospital or clinic, you can use: ? An automated blood pressure machine at a pharmacy. ? A home blood pressure monitor.  Talk to your health care provider about your target blood pressure.  If you are between 45-20 years old, ask your health care provider if you should take aspirin  to prevent heart disease.  Have regular diabetes screenings by checking your fasting blood sugar level. ? If you are at a normal weight and have a low risk for diabetes, have this test once every three years after the age of 45. ? If you are overweight and have a high risk for diabetes, consider being tested at a younger age or more often.  A one-time screening for abdominal aortic aneurysm (AAA) by ultrasound is recommended for men aged 45-75 years who are current or former smokers. What should I know about preventing infection? Hepatitis B If you have a higher risk for hepatitis B, you should be screened for this virus. Talk with your health care provider to find out if you are at risk for hepatitis B infection. Hepatitis C Blood testing is recommended for:  Everyone born from [birth year] through 1963.  Anyone with known risk factors for hepatitis C.  Sexually Transmitted Diseases (STDs)  You should be screened each year for STDs including gonorrhea and chlamydia if: ? You are sexually active and are younger than 45 years of age. ? You are older than 45 years of age and your health care provider tells you that you are at risk for this type of infection. ? Your sexual activity has changed since you were last screened and you are at an increased risk for chlamydia or gonorrhea. Ask your health care provider if you are at risk.  Talk with your health care provider about whether you are at high risk of being infected with HIV. Your health care provider may recommend a prescription medicine to help prevent HIV infection.  What else can I do?  Schedule regular health, dental, and eye exams.  Stay current with your vaccines (immunizations).  Do not use any tobacco products, such as cigarettes, chewing tobacco, and e-cigarettes. If you need help quitting, ask your health care provider.  Limit alcohol intake to no more than 2 drinks per day. One drink equals 12 ounces of beer, 5 ounces of wine,  or 1 ounces of hard liquor.  Do not use street drugs.  Do not share needles.  Ask your health care provider for help if you need support or information about quitting drugs.  Tell your health care provider if you often feel depressed.  Tell your health care provider if you have ever been abused or do not feel safe at home. This information is not intended to replace advice given to you by your health care provider. Make sure you discuss any questions you have with your health care provider. Document Released: 08/25/2007 Document Revised: 10/26/2015 Document Reviewed: 11/30/2014 Elsevier Interactive Patient Education  2018 [employer].   Erectile Dysfunction Erectile dysfunction (ED) is the inability to get or keep an erection in order to have sexual intercourse. Erectile dysfunction may include:  Inability to get an erection.  Lack of enough hardness of the erection to allow penetration.  Loss  of the erection before sex is finished.  What are the causes? This condition may be caused by:  Certain medicines, such as: ? Pain relievers. ? Antihistamines. ? Antidepressants. ? Blood pressure medicines. ? Water pills (diuretics). ? Ulcer medicines. ? Muscle relaxants. ? Drugs.  Excessive drinking.  Psychological causes, such as: ? Anxiety. ? Depression. ? Sadness. ? Exhaustion. ? Performance fear. ? Stress.  Physical causes, such as: ? Artery problems. This may include diabetes, smoking, liver disease, or atherosclerosis. ? High blood pressure. ? Hormonal problems, such as low testosterone. ? Obesity. ? Nerve problems. This may include back or pelvic injuries, diabetes mellitus, multiple sclerosis, or Parkinson disease.  What are the signs or symptoms? Symptoms of this condition include:  Inability to get an erection.  Lack of enough hardness of the erection to allow penetration.  Loss of the erection before sex is finished.  Normal erections at some times,  but with frequent unsatisfactory episodes.  Low sexual satisfaction in either partner due to erection problems.  A curved penis occurring with erection. The curve may cause pain or the penis may be too curved to allow for intercourse.  Never having nighttime erections.  How is this diagnosed? This condition is often diagnosed by:  Performing a physical exam to find other diseases or specific problems with the penis.  Asking you detailed questions about the problem.  Performing blood tests to check for diabetes mellitus or to measure hormone levels.  Performing other tests to check for underlying health conditions.  Performing an ultrasound exam to check for scarring.  Performing a test to check blood flow to the penis.  Doing a sleep study at home to measure nighttime erections.  How is this treated? This condition may be treated by:  Medicine taken by mouth to help you achieve an erection (oral medicine).  Hormone replacement therapy to replace low testosterone levels.  Medicine that is injected into the penis. Your health care provider may instruct you how to give yourself these injections at home.  Vacuum pump. This is a pump with a ring on it. The pump and ring are placed on the penis and used to create pressure that helps the penis become erect.  Penile implant surgery. In this procedure, you may receive: ? An inflatable implant. This consists of cylinders, a pump, and a reservoir. The cylinders can be inflated with a fluid that helps to create an erection, and they can be deflated after intercourse. ? A semi-rigid implant. This consists of two silicone rubber rods. The rods provide some rigidity. They are also flexible, so the penis can both curve downward in its normal position and become straight for sexual intercourse.  Blood vessel surgery, to improve blood flow to the penis. During this procedure, a blood vessel from a different part of the body is placed into the  penis to allow blood to flow around (bypass) damaged or blocked blood vessels.  Lifestyle changes, such as exercising more, losing weight, and quitting smoking.  Follow these instructions at home: Medicines  Take over-the-counter and prescription medicines only as told by your health care provider. Do not increase the dosage without first discussing it with your health care provider.  If you are using self-injections, perform injections as directed by your health care provider. Make sure to avoid any veins that are on the surface of the penis. After giving an injection, apply pressure to the injection site for 5 minutes. General instructions  Exercise regularly, as directed by  your health care provider. Work with your health care provider to lose weight, if needed.  Do not use any products that contain nicotine or tobacco, such as cigarettes and e-cigarettes. If you need help quitting, ask your health care provider.  Before using a vacuum pump, read the instructions that come with the pump and discuss any questions with your health care provider.  Keep all follow-up visits as told by your health care provider. This is important. Contact a health care provider if:  You feel nauseous.  You vomit. Get help right away if:  You are taking oral or injectable medicines and you have an erection that lasts longer than 4 hours. If your health care provider is unavailable, go to the nearest emergency room for evaluation. An erection that lasts much longer than 4 hours can result in permanent damage to your penis.  You have severe pain in your groin or abdomen.  You develop redness or severe swelling of your penis.  You have redness spreading up into your groin or lower abdomen.  You are unable to urinate.  You experience chest pain or a rapid heart beat (palpitations) after taking oral medicines. Summary  Erectile dysfunction (ED) is the inability to get or keep an erection during sexual  intercourse. This problem can usually be treated successfully.  This condition is diagnosed based on a physical exam, your symptoms, and tests to determine the cause. Treatment varies depending on the cause, and may include medicines, hormone therapy, surgery, or vacuum pump.  You may need follow-up visits to make sure that you are using your medicines or devices correctly.  Get help right away if you are taking or injecting medicines and you have an erection that lasts longer than 4 hours. This information is not intended to replace advice given to you by your health care provider. Make sure you discuss any questions you have with your health care provider. Document Released: 02/24/2000 Document Revised: 03/14/2016 Document Reviewed: 03/14/2016 Elsevier Interactive Patient Education  2017 [employer].

## 2018-01-30 NOTE — Assessment & Plan Note (Signed)
Reviewed annual screening exams, healthy lifestyle,  additional information provided on AVS - CBC; Future - Comprehensive metabolic panel; Future - Lipid panel; Future - Hemoglobin A1c; Future - PSA; Future  Screening for cholesterol level He is fasting - Lipid panel; Future  Screening for prostate cancer - PSA; Future  Elevated glucose level - Hemoglobin A1c; Future  Need for Tdap vaccination  - Tdap vaccine greater than or equal to 45yo IM

## 2018-01-30 NOTE — Addendum Note (Signed)
Addended by: Lance Sell on: 01/30/2018 09:25 AM   Modules accepted: Orders

## 2018-01-30 NOTE — Progress Notes (Addendum)
Danny Zimmerman is a 47 y.o. male with the following history as recorded in Epic[facility]:  Patient Active Problem List   Diagnosis Date Noted  . Encounter for general adult medical examination with abnormal findings 01/24/2017    Current Outpatient Medications  Medication Sig Dispense Refill  . ibuprofen (ADVIL,MOTRIN) 200 MG tablet Take 400 mg by mouth every 6 (six) hours as needed for moderate pain.     No current facility-administered medications for this visit.     Allergies: Patient has no known allergies.  Past Medical History:  Diagnosis Date  . GERD (gastroesophageal reflux disease)   . Ureteral stone     Past Surgical History:  Procedure Laterality Date  . CYSTOSCOPY/RETROGRADE/URETEROSCOPY/STONE EXTRACTION WITH BASKET Left 02/01/2015   Procedure: CYSTOSCOPY/LEFT RETROGRADE/LEFT URETERAL BALLON DILITATION/LEFT /URETEROSCOPY/STONE EXTRACTION WITH BASKET LEFT URETERAL STENT;  Surgeon: Carolan Clines, MD;  Location: [facility];  Service: Urology;  Laterality: Left;    Family History  Problem Relation Age of Onset  . Colon cancer Neg Hx   . Esophageal cancer Neg Hx   . Stomach cancer Neg Hx     Social History   Tobacco Use  . Smoking status: Never Smoker  . Smokeless tobacco: Never Used  Substance Use Topics  . Alcohol use: Yes    Comment: socially     Subjective:  Here today for CPE:  Last dental exam: 2019 Last vision exam: 2019 Lipids: lipid panel ordered DM screening- A1c ordered Vaccinations: tdap today, declines flu vacc Diet and exercise: exercises 4 days a week, tries to follow healthy balanced diet  Review of Systems  Constitutional: Negative for chills and fever.  HENT: Negative for hearing loss.   Eyes: Negative for blurred vision and double vision.  Respiratory: Negative for cough and shortness of breath.   Cardiovascular: Negative for chest pain and palpitations.  Gastrointestinal: Negative for abdominal pain, constipation and diarrhea.   Genitourinary: Negative for dysuria, flank pain, frequency, hematuria and urgency.  Musculoskeletal: Positive for joint pain. Negative for falls.  Skin: Negative for rash.  Neurological: Negative for speech change, loss of consciousness, weakness and headaches.  Endo/Heme/Allergies: Does not bruise/bleed easily.  Psychiatric/Behavioral: Negative for depression. The patient is not nervous/anxious.    Left Shoulder pain- x 4 weeks-intermittent sore, aching pain, hurts to move or lay on that side, difficult to fully extend arm due to pain, denies any noted activity at onset but does go to gym, lifts weights about 4 days/week Has not tried anything for pain at home  Erectile dysfunction - this is a new problem, this occurred once last week, while having sex with his wife, and was unable to finish, this has not happened to him before now.  Objective:  Vitals:   01/30/18 0821  BP: 120/82  Pulse: 73  SpO2: 96%  Weight: 169 lb (76.7 kg)  Height: 5\' 5"  (1.651 m)    General: Well developed, well nourished, in no acute distress  Skin : Warm and dry.  Head: Normocephalic and atraumatic  Eyes: Sclera and conjunctiva clear; pupils round and reactive to light; extraocular movements intact  Ears: External normal; canals clear; tympanic membranes normal  Oropharynx: Pink, supple. No suspicious lesions  Neck: Supple without thyromegaly, adenopathy  Lungs: Respirations unlabored; clear to auscultation bilaterally without wheeze, rales, rhonchi  CVS exam: normal rate, regular rhythm, normal S1, S2, no murmurs, rubs, clicks or gallops.  Abdomen: Soft; nontender; nondistended; normoactive bowel sounds; no masses or hepatosplenomegaly  Musculoskeletal: No deformities  Extremities: No  edema, cyanosis Vessels: Symmetric bilaterally  Neurologic: Alert and oriented; speech intact; face symmetrical; moves all extremities well; CNII-XII intact without focal deficit  Psychiatric: Normal mood and  affect.  Assessment:  1. Encounter for general adult medical examination with abnormal findings   2. Acute pain of right shoulder   3. Erectile dysfunction, unspecified erectile dysfunction type   4. Screening for cholesterol level   5. Screening for prostate cancer   6. Elevated glucose level   7. Need for Tdap vaccination     Plan:   Return in about 1 year (around 01/31/2019) for CPE.  Orders Placed This Encounter  Procedures  . DG Shoulder Right    Standing Status:   Future    Standing Expiration Date:   04/02/2019    Order Specific Question:   Reason for Exam (SYMPTOM  OR DIAGNOSIS REQUIRED)    Answer:   pain x 1 month    Order Specific Question:   Preferred imaging location?    Answer:   [facility]    Order Specific Question:   Radiology Contrast Protocol - do NOT remove file path    Answer:   [email or web address]  . Tdap vaccine greater than or equal to 7yo IM  . CBC    Standing Status:   Future    Number of Occurrences:   1    Standing Expiration Date:   01/31/2019  . Comprehensive metabolic panel    Standing Status:   Future    Number of Occurrences:   1    Standing Expiration Date:   01/31/2019  . Lipid panel    Standing Status:   Future    Number of Occurrences:   1    Standing Expiration Date:   01/31/2019  . Hemoglobin A1c    Standing Status:   Future    Number of Occurrences:   1    Standing Expiration Date:   01/31/2019  . Testosterone    Standing Status:   Future    Number of Occurrences:   1    Standing Expiration Date:   01/30/2019  . PSA    Standing Status:   Future    Number of Occurrences:   1    Standing Expiration Date:   01/31/2019  . TSH    Standing Status:   Future    Number of Occurrences:   1    Standing Expiration Date:   01/31/2019    Requested Prescriptions    No prescriptions requested or ordered in this encounter    Acute pain of right shoulder He is not interested in medication for  pain We discussed imaging today, he is agreeable F/U with further recommendations pending xray results - DG Shoulder Left; Future  Erectile dysfunction, unspecified erectile dysfunction type Home management, red flags and return precautions including when to seek immediate care discussed and printed on AVS Update labs F/U with further recommendations pending lab results, we discussed possible trial of viagra if labs are all normal - CBC; Future - Comprehensive metabolic panel; Future - Lipid panel; Future - Hemoglobin A1c; Future - Testosterone; Future - PSA; Future - TSH; Future

## 2018-12-23 DIAGNOSIS — Z20828 Contact with and (suspected) exposure to other viral communicable diseases: Secondary | ICD-10-CM | POA: Diagnosis not present

## 2018-12-25 IMAGING — CT CT RENAL STONE PROTOCOL
2 of 4 series · 15 of 46 positions shown, 17 images · non-contrast
Comparison: 01/30/2015 uble.

CLINICAL DATA: Right abdominal pain that started earlier today.

EXAM:
CT ABDOMEN AND PELVIS WITHOUT CONTRAST
TECHNIQUE: Multidetector CT imaging of the abdomen and pelvis was performed
following the standard protocol without IV contrast.

[Series 2: axial st · axial · 0.84mm/px · z∈[-346,+68]mm · 12 of 95 slices shown, 14 images]
[im 6/95  soft-tissue]
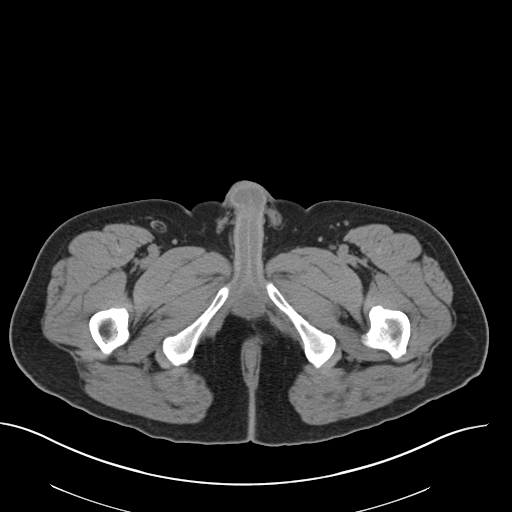
[im 6/95  bone]
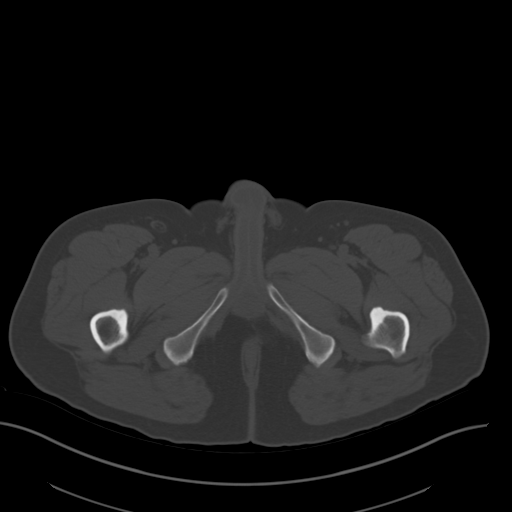
[im 17/95  soft-tissue]
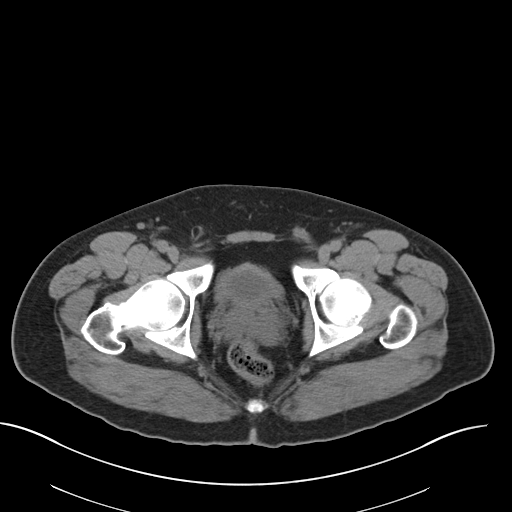
[im 23/95  soft-tissue]
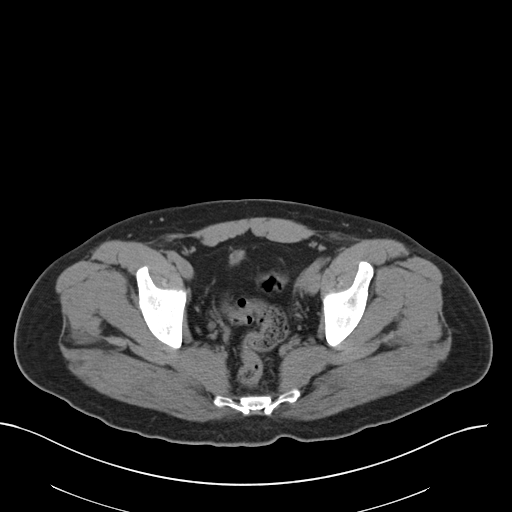
[im 28/95  soft-tissue]
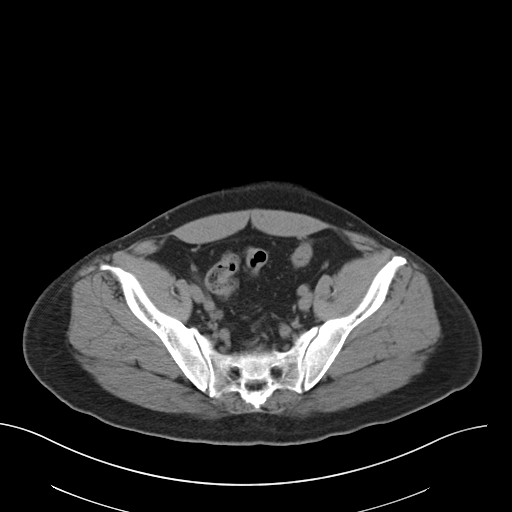
[im 39/95  soft-tissue]
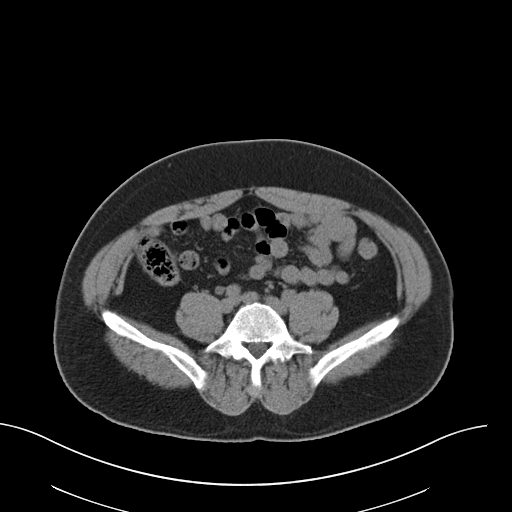
[im 45/95  soft-tissue]
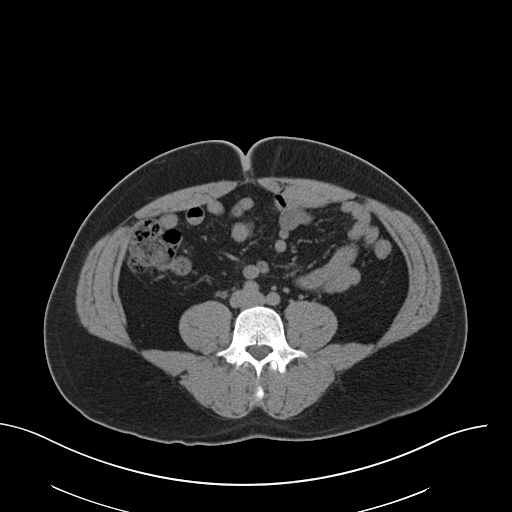
[im 50/95  soft-tissue]
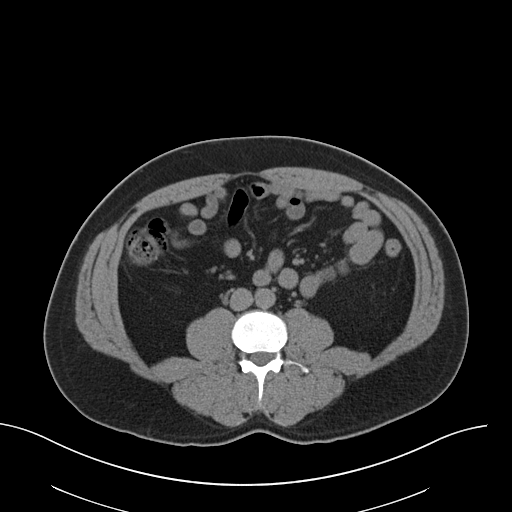
[im 61/95  soft-tissue]
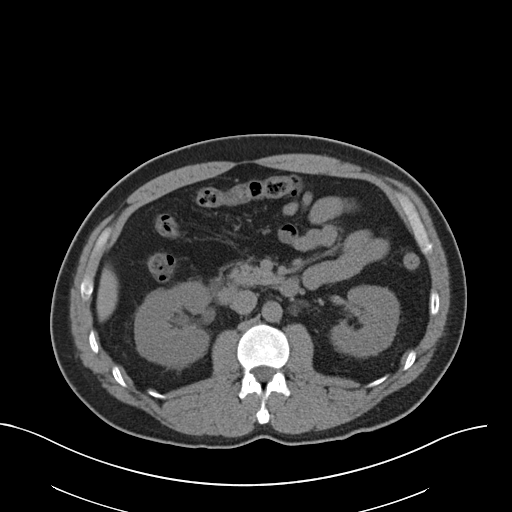
[im 67/95  soft-tissue]
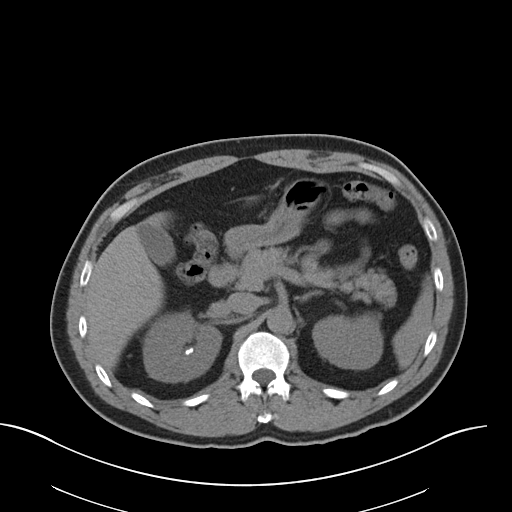
[im 67/95  bone]
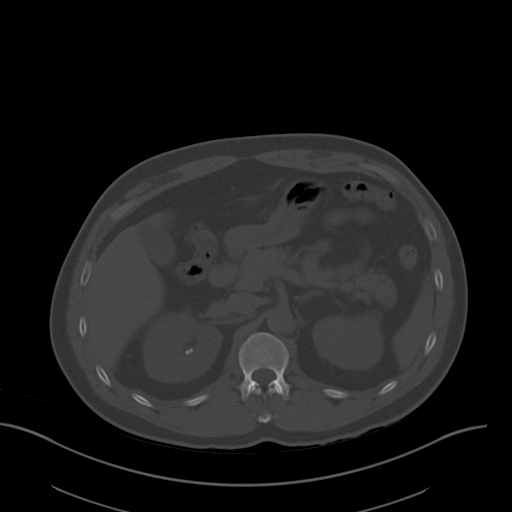
[im 72/95  soft-tissue]
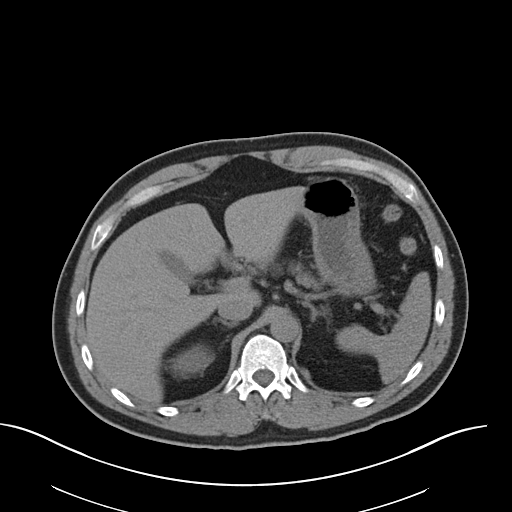
[im 83/95  soft-tissue]
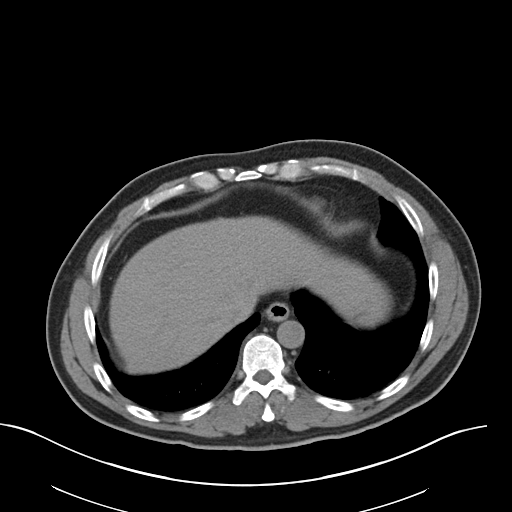
[im 89/95  soft-tissue]
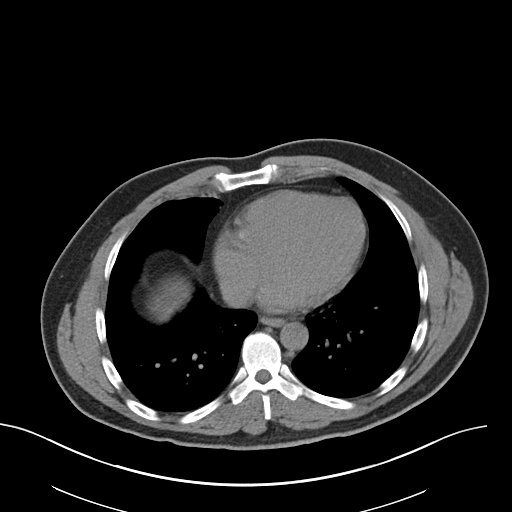

[Series 5: coronal · coronal · 0.73mm/px · 3 of 148 slices shown]
[im 50/148  soft-tissue]
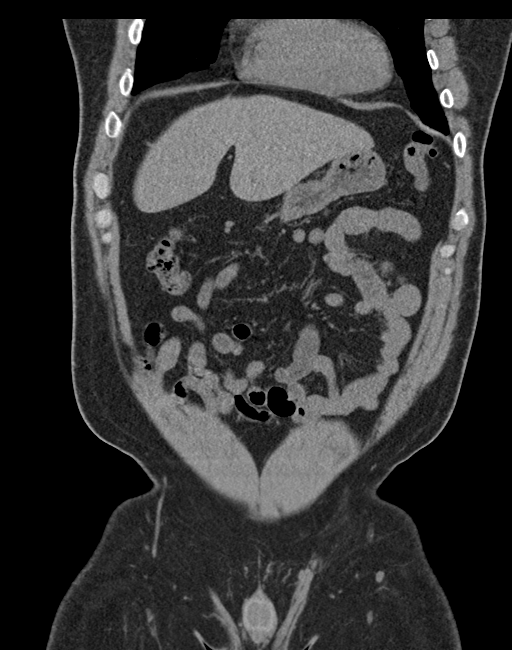
[im 66/148  soft-tissue]
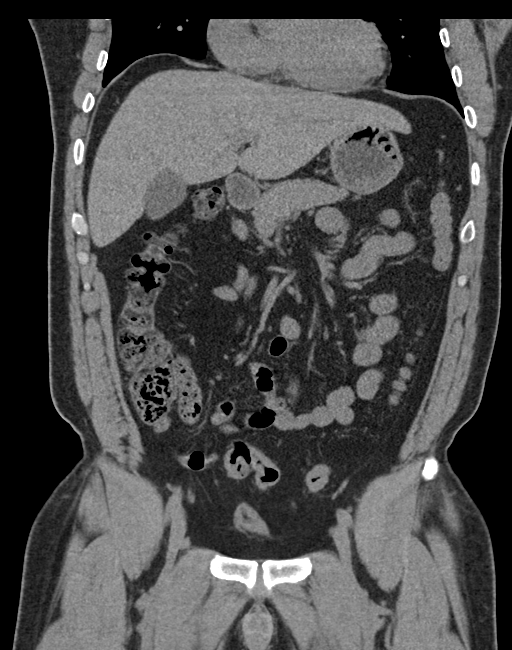
[im 82/148  soft-tissue]
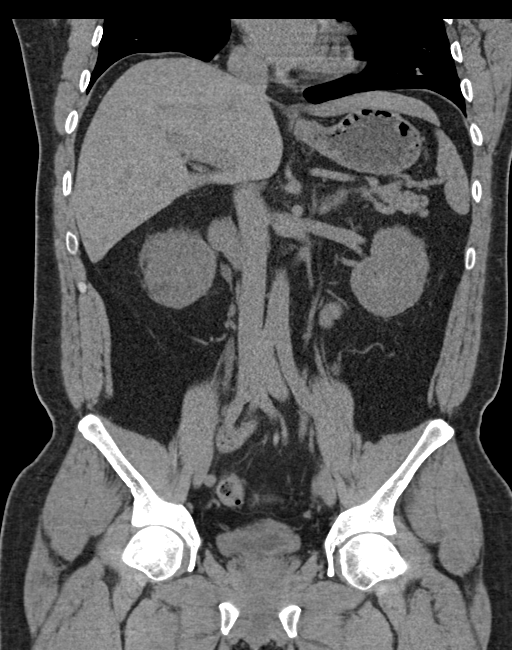

[15 of 46 positions shown; findings below may reference images not displayed]

FINDINGS: Lower chest: Unremarkable.

Hepatobiliary: No focal abnormality in the liver on this study
without intravenous contrast. There is no evidence for gallstones,
gallbladder wall thickening, or pericholecystic fluid. No
intrahepatic or extrahepatic biliary dilation.

Pancreas: No focal mass lesion. No dilatation of the main duct. No
intraparenchymal cyst. No peripancreatic edema.

Spleen: No splenomegaly. No focal mass lesion.

Adrenals/Urinary Tract: No adrenal nodule or mass.

12 mm water density lesion lower pole right kidney is stable and
compatible with a cyst. Adjacent nonobstructing stones in the upper
pole the right kidney each measure about 2 mm. There is mild
fullness of the right intrarenal collecting system and ureter. 2 x 3
mm stone is identified at the distal right ureter, just proximal to
the UVJ.

Left kidney unremarkable. No left renal or ureteral stone.

No bladder stone.

Stomach/Bowel: Stomach is nondistended. No gastric wall thickening.
No evidence of outlet obstruction. Duodenum is normally positioned
as is the ligament of Treitz. No small bowel wall thickening. No
small bowel dilatation. The terminal ileum is normal. The appendix
is normal. No gross colonic mass. No colonic wall thickening. No
substantial diverticular change.

Vascular/Lymphatic: No abdominal aortic aneurysm. No abdominal
aortic atherosclerotic calcification. There is no gastrohepatic or
hepatoduodenal ligament lymphadenopathy. No intraperitoneal or
retroperitoneal lymphadenopathy. No pelvic sidewall lymphadenopathy.

Reproductive: The prostate gland and seminal vesicles have normal
imaging features.

Other: No intraperitoneal free fluid.

Musculoskeletal: Bone windows reveal no worrisome lytic or sclerotic
osseous lesions.
IMPRESSION: 1. 2 x 3 mm distal right ureteral stone, just proximal to the UVJ.
Mild associated right hydroureteronephrosis.
2. Adjacent tiny nonobstructing stones upper pole right kidney..

## 2019-01-03 DIAGNOSIS — Z20828 Contact with and (suspected) exposure to other viral communicable diseases: Secondary | ICD-10-CM | POA: Diagnosis not present

## 2019-02-09 ENCOUNTER — Telehealth: Payer: Self-pay | Admitting: Nurse Practitioner

## 2019-02-09 NOTE — Telephone Encounter (Signed)
Yes, will accept

## 2019-02-09 NOTE — Telephone Encounter (Signed)
Copied from [place] [id number]. Topic: Appointment Scheduling - Scheduling Inquiry for Clinic >> Feb 09, 2019 12:59 PM Virl Axe D wrote: Reason for CRM: Pt's wife called to schedule CPE for pt. Formerly saw [facility] Pt's wife transitioned to Dr. Quay Burow and would like to know if she would take on pt as well. Please advise. Pt's wife is Bailey Mech MRN [id number]  Dr.Burns please advise if you are willing to accept. Thank you.

## 2019-02-10 DIAGNOSIS — R7303 Prediabetes: Secondary | ICD-10-CM | POA: Insufficient documentation

## 2019-02-10 NOTE — Telephone Encounter (Signed)
Appointment has been made for 12/2.

## 2019-02-10 NOTE — Patient Instructions (Addendum)
Have a chest xray and blood work today.  Tests ordered today. Your results will be released to [place] (or called to you) after review.  If any changes need to be made, you will be notified at that same time.  All other Health Maintenance issues reviewed.   All recommended immunizations and age-appropriate screenings are up-to-date or discussed.  No immunization administered today.   Medications reviewed and updated.  Changes include :   none   A referral was ordered for pulmonary  Please followup in 1 year    Health Maintenance, Male Adopting a healthy lifestyle and getting preventive care are important in promoting health and wellness. Ask your health care provider about:  The right schedule for you to have regular tests and exams.  Things you can do on your own to prevent diseases and keep yourself healthy. What should I know about diet, weight, and exercise? Eat a healthy diet   Eat a diet that includes plenty of vegetables, fruits, low-fat dairy products, and lean protein.  Do not eat a lot of foods that are high in solid fats, added sugars, or sodium. Maintain a healthy weight Body mass index (BMI) is a measurement that can be used to identify possible weight problems. It estimates body fat based on height and weight. Your health care provider can help determine your BMI and help you achieve or maintain a healthy weight. Get regular exercise Get regular exercise. This is one of the most important things you can do for your health. Most adults should:  Exercise for at least 150 minutes each week. The exercise should increase your heart rate and make you sweat (moderate-intensity exercise).  Do strengthening exercises at least twice a week. This is in addition to the moderate-intensity exercise.  Spend less time sitting. Even light physical activity can be beneficial. Watch cholesterol and blood lipids Have your blood tested for lipids and cholesterol at 46 years of age,  then have this test every 5 years. You may need to have your cholesterol levels checked more often if:  Your lipid or cholesterol levels are high.  You are older than 46 years of age.  You are at high risk for heart disease. What should I know about cancer screening? Many types of cancers can be detected early and may often be prevented. Depending on your health history and family history, you may need to have cancer screening at various ages. This may include screening for:  Colorectal cancer.  Prostate cancer.  Skin cancer.  Lung cancer. What should I know about heart disease, diabetes, and high blood pressure? Blood pressure and heart disease  High blood pressure causes heart disease and increases the risk of stroke. This is more likely to develop in people who have high blood pressure readings, are of African descent, or are overweight.  Talk with your health care provider about your target blood pressure readings.  Have your blood pressure checked: ? Every 3-5 years if you are 47-37 years of age. ? Every year if you are 60 years old or older.  If you are between the ages of 77 and 85 and are a current or former smoker, ask your health care provider if you should have a one-time screening for abdominal aortic aneurysm (AAA). Diabetes Have regular diabetes screenings. This checks your fasting blood sugar level. Have the screening done:  Once every three years after age 91 if you are at a normal weight and have a low risk for diabetes.  More often and at a younger age if you are overweight or have a high risk for diabetes. What should I know about preventing infection? Hepatitis B If you have a higher risk for hepatitis B, you should be screened for this virus. Talk with your health care provider to find out if you are at risk for hepatitis B infection. Hepatitis C Blood testing is recommended for:  Everyone born from [birth year] through 1963.  Anyone with known risk factors  for hepatitis C. Sexually transmitted infections (STIs)  You should be screened each year for STIs, including gonorrhea and chlamydia, if: ? You are sexually active and are younger than 46 years of age. ? You are older than 46 years of age and your health care provider tells you that you are at risk for this type of infection. ? Your sexual activity has changed since you were last screened, and you are at increased risk for chlamydia or gonorrhea. Ask your health care provider if you are at risk.  Ask your health care provider about whether you are at high risk for HIV. Your health care provider may recommend a prescription medicine to help prevent HIV infection. If you choose to take medicine to prevent HIV, you should first get tested for HIV. You should then be tested every 3 months for as long as you are taking the medicine. Follow these instructions at home: Lifestyle  Do not use any products that contain nicotine or tobacco, such as cigarettes, e-cigarettes, and chewing tobacco. If you need help quitting, ask your health care provider.  Do not use street drugs.  Do not share needles.  Ask your health care provider for help if you need support or information about quitting drugs. Alcohol use  Do not drink alcohol if your health care provider tells you not to drink.  If you drink alcohol: ? Limit how much you have to 0-2 drinks a day. ? Be aware of how much alcohol is in your drink. In the [place] one drink equals one 12 oz bottle of beer (355 mL), one 5 oz glass of wine (148 mL), or one 1 oz glass of hard liquor (44 mL). General instructions  Schedule regular health, dental, and eye exams.  Stay current with your vaccines.  Tell your health care provider if: ? You often feel depressed. ? You have ever been abused or do not feel safe at home. Summary  Adopting a healthy lifestyle and getting preventive care are important in promoting health and wellness.  Follow your health  care provider's instructions about healthy diet, exercising, and getting tested or screened for diseases.  Follow your health care provider's instructions on monitoring your cholesterol and blood pressure. This information is not intended to replace advice given to you by your health care provider. Make sure you discuss any questions you have with your health care provider. Document Released: 08/25/2007 Document Revised: 02/19/2018 Document Reviewed: 02/19/2018 Elsevier Patient Education  2020 [employer].

## 2019-02-10 NOTE — Progress Notes (Addendum)
Subjective:    Patient ID: Danny Zimmerman, male    DOB: [birth year], 48 y.o.   MRN: [id number]  HPI  He is here to establish with a new pcp.   He is here for a physical exam.    He had COVID in October.  The past few weeks his body has felt odd.  He has felt a little SOB with walking and he did not have that prior to Covid.   When he experiences the shortness of breath he also feels chest tightness, and not in his throat and some palpitations.  He is also noted intermittent fatigue, waking up with mild headaches most mornings and feels like he is forgetting things more than he should.  He is concerned that these are residual symptoms from Covid.  Prior to Covid he was exercising regularly, but has not had the energy since having Covid to exercise.  His COVID symptoms were nasal congestion, warm sensation in body, fatigue, mild cough and tightness of the chest.   He has no other concerns besides the above.   Medications and allergies reviewed with patient and updated if appropriate.  Patient Active Problem List   Diagnosis Date Noted  . Prediabetes 02/10/2019    Current Outpatient Medications on File Prior to Visit  Medication Sig Dispense Refill  . ibuprofen (ADVIL,MOTRIN) 200 MG tablet Take 400 mg by mouth every 6 (six) hours as needed for moderate pain.     No current facility-administered medications on file prior to visit.     Past Medical History:  Diagnosis Date  . GERD (gastroesophageal reflux disease)   . Ureteral stone     Past Surgical History:  Procedure Laterality Date  . CYSTOSCOPY/RETROGRADE/URETEROSCOPY/STONE EXTRACTION WITH BASKET Left 02/01/2015   Procedure: CYSTOSCOPY/LEFT RETROGRADE/LEFT URETERAL BALLON DILITATION/LEFT /URETEROSCOPY/STONE EXTRACTION WITH BASKET LEFT URETERAL STENT;  Surgeon: Carolan Clines, MD;  Location: [facility];  Service: Urology;  Laterality: Left;    Social History   Socioeconomic History  . Marital status: Married    Spouse  name: Not on file  . Number of children: 3  . Years of education: Not on file  . Highest education level: Not on file  Occupational History  . Occupation: Therapist, nutritional    Comment: [employer]  . Financial resource strain: Not on file  . Food insecurity    Worry: Not on file    Inability: Not on file  . Transportation needs    Medical: No    Non-medical: No  Tobacco Use  . Smoking status: Never Smoker  . Smokeless tobacco: Never Used  Substance and Sexual Activity  . Alcohol use: Yes    Comment: socially  . Drug use: No  . Sexual activity: Yes    Partners: Female  Lifestyle  . Physical activity    Days per week: Not on file    Minutes per session: Not on file  . Stress: Not on file  Relationships  . Social Herbalist on phone: Not on file    Gets together: Not on file    Attends religious service: Not on file    Active member of club or organization: Not on file    Attends meetings of clubs or organizations: Not on file    Relationship status: Not on file  Other Topics Concern  . Not on file  Social History Narrative   Pt lives in 2 story home with his wife and 3 children  High school graduate   Works as Therapist, nutritional with [employer]    Family History  Problem Relation Age of Onset  . Colon cancer Neg Hx   . Esophageal cancer Neg Hx   . Stomach cancer Neg Hx     Review of Systems  Constitutional: Positive for fatigue (intermittent). Negative for chills and fever.  HENT:       Decreased taste, smell since COVID  Eyes: Negative for visual disturbance.  Respiratory: Positive for shortness of breath (walking around work associated with tightness in chest and knot in throat). Negative for cough and wheezing.   Cardiovascular: Positive for palpitations (with SOB). Negative for chest pain and leg swelling.  Gastrointestinal: Negative for abdominal pain, blood in stool, constipation, diarrhea and nausea.       No gerd  Genitourinary:  Negative for difficulty urinating, dysuria and hematuria.  Musculoskeletal: Negative for arthralgias and back pain.  Skin: Negative for color change and rash.  Neurological: Positive for light-headedness (SOB) and headaches. Negative for dizziness.  Psychiatric/Behavioral: Positive for sleep disturbance. Negative for dysphoric mood. The patient is nervous/anxious.        Objective:   Vitals:   02/11/19 1006  BP: 124/84  Pulse: 70  Resp: 16  Temp: 98.5 F (36.9 C)  SpO2: 99%   Filed Weights   02/11/19 1006  Weight: 173 lb 12.8 oz (78.8 kg)   Body mass index is 28.92 kg/m.  BP Readings from Last 3 Encounters:  02/11/19 124/84  01/30/18 120/82  04/12/17 126/84    Wt Readings from Last 3 Encounters:  02/11/19 173 lb 12.8 oz (78.8 kg)  01/30/18 169 lb (76.7 kg)  04/12/17 179 lb (81.2 kg)     Physical Exam Constitutional: He appears well-developed and well-nourished. No distress.  HENT:  Head: Normocephalic and atraumatic.  Right Ear: External ear normal.  Left Ear: External ear normal.  Mouth/Throat: Oropharynx is clear and moist.  Normal ear canals and TM b/l  Eyes: Conjunctivae and EOM are normal.  Neck: Neck supple. No tracheal deviation present. No thyromegaly present.  No carotid bruit  Cardiovascular: Normal rate, regular rhythm, normal heart sounds and intact distal pulses.   No murmur heard. Pulmonary/Chest: Effort normal and breath sounds normal. No respiratory distress. He has no wheezes. He has no rales.  Abdominal: Soft. He exhibits no distension. There is no tenderness.  Genitourinary: deferred  Musculoskeletal: He exhibits no edema.  Lymphadenopathy:   He has no cervical adenopathy.  Skin: Skin is warm and dry. He is not diaphoretic.  Psychiatric: He has a normal mood and affect. His behavior is normal.         Assessment & Plan:   Physical exam: Screening blood work  ordered Immunizations  tdap up to date, deferred flu Exercise   Prior to  COVID 3-4 days a week ( shadow boxing, pushups, sit ups - none since then - feels fatigued / no energy Weight   overweight Substance abuse   none  See Problem List for Assessment and Plan of chronic medical problems.   Follow-up annually   This visit occurred during the SARS-CoV-2 public health emergency.  Safety protocols were in place, including screening questions prior to the visit, additional usage of staff PPE, and extensive cleaning of exam room while observing appropriate contact time as indicated for disinfecting solutions.

## 2019-02-11 ENCOUNTER — Other Ambulatory Visit (INDEPENDENT_AMBULATORY_CARE_PROVIDER_SITE_OTHER): Payer: BC Managed Care – PPO

## 2019-02-11 ENCOUNTER — Encounter: Payer: Self-pay | Admitting: Internal Medicine

## 2019-02-11 ENCOUNTER — Other Ambulatory Visit: Payer: Self-pay

## 2019-02-11 ENCOUNTER — Ambulatory Visit (INDEPENDENT_AMBULATORY_CARE_PROVIDER_SITE_OTHER)
Admission: RE | Admit: 2019-02-11 | Discharge: 2019-02-11 | Disposition: A | Discharge status: Home / Self Care | Payer: BC Managed Care – PPO | Source: Ambulatory Visit | Attending: Internal Medicine | Admitting: Internal Medicine

## 2019-02-11 ENCOUNTER — Ambulatory Visit (INDEPENDENT_AMBULATORY_CARE_PROVIDER_SITE_OTHER): Payer: BC Managed Care – PPO | Admitting: Internal Medicine

## 2019-02-11 VITALS — BP 124/84 | HR 70 | Temp 98.5°F | Resp 16 | Ht 65.0 in | Wt 173.8 lb

## 2019-02-11 DIAGNOSIS — R0600 Dyspnea, unspecified: Secondary | ICD-10-CM

## 2019-02-11 DIAGNOSIS — Z87442 Personal history of urinary calculi: Secondary | ICD-10-CM

## 2019-02-11 DIAGNOSIS — R0789 Other chest pain: Secondary | ICD-10-CM | POA: Diagnosis not present

## 2019-02-11 DIAGNOSIS — R7303 Prediabetes: Secondary | ICD-10-CM | POA: Diagnosis not present

## 2019-02-11 DIAGNOSIS — R0609 Other forms of dyspnea: Secondary | ICD-10-CM

## 2019-02-11 DIAGNOSIS — Z0000 Encounter for general adult medical examination without abnormal findings: Secondary | ICD-10-CM

## 2019-02-11 DIAGNOSIS — R002 Palpitations: Secondary | ICD-10-CM | POA: Diagnosis not present

## 2019-02-11 LAB — CBC WITH DIFFERENTIAL/PLATELET
Basophils Absolute: 0 10*3/uL (ref 0.0–0.1)
Basophils Relative: 0.8 % (ref 0.0–3.0)
Eosinophils Absolute: 0 10*3/uL (ref 0.0–0.7)
Eosinophils Relative: 0.9 % (ref 0.0–5.0)
HCT: 48 % (ref 39.0–52.0)
Hemoglobin: 16.3 g/dL (ref 13.0–17.0)
Lymphocytes Relative: 40.3 % (ref 12.0–46.0)
Lymphs Abs: 2.2 10*3/uL (ref 0.7–4.0)
MCHC: 33.9 g/dL (ref 30.0–36.0)
MCV: 84.2 fl (ref 78.0–100.0)
Monocytes Absolute: 0.4 10*3/uL (ref 0.1–1.0)
Monocytes Relative: 7 % (ref 3.0–12.0)
Neutro Abs: 2.8 10*3/uL (ref 1.4–7.7)
Neutrophils Relative %: 51 % (ref 43.0–77.0)
Platelets: 322 10*3/uL (ref 150.0–400.0)
RBC: 5.7 Mil/uL (ref 4.22–5.81)
RDW: 13.1 % (ref 11.5–15.5)
WBC: 5.5 10*3/uL (ref 4.0–10.5)

## 2019-02-11 LAB — COMPREHENSIVE METABOLIC PANEL
ALT: 26 U/L (ref 0–53)
AST: 22 U/L (ref 0–37)
Albumin: 4.7 g/dL (ref 3.5–5.2)
Alkaline Phosphatase: 78 U/L (ref 39–117)
BUN: 11 mg/dL (ref 6–23)
CO2: 29 mEq/L (ref 19–32)
Calcium: 9.8 mg/dL (ref 8.4–10.5)
Chloride: 102 mEq/L (ref 96–112)
Creatinine, Ser: 0.85 mg/dL (ref 0.40–1.50)
GFR: 97.04 mL/min (ref >60.00)
Glucose, Bld: 96 mg/dL (ref 70–99)
Potassium: 3.9 mEq/L (ref 3.5–5.1)
Sodium: 137 mEq/L (ref 135–145)
Total Bilirubin: 0.9 mg/dL (ref 0.2–1.2)
Total Protein: 8.2 g/dL (ref 6.0–8.3)

## 2019-02-11 LAB — LIPID PANEL
Cholesterol: 218 mg/dL — ABNORMAL HIGH (ref 0–200)
HDL: 49.9 mg/dL (ref >39.00)
NonHDL: 168.06
Total CHOL/HDL Ratio: 4
Triglycerides: 262 mg/dL — ABNORMAL HIGH (ref 0.0–149.0)
VLDL: 52.4 mg/dL — ABNORMAL HIGH (ref 0.0–40.0)

## 2019-02-11 LAB — LDL CHOLESTEROL, DIRECT: Direct LDL: 105 mg/dL

## 2019-02-11 LAB — TSH: TSH: 0.99 u[IU]/mL (ref 0.35–4.50)

## 2019-02-11 LAB — HEMOGLOBIN A1C: Hgb A1c MFr Bld: 5.8 % (ref 4.6–6.5)

## 2019-02-11 NOTE — Assessment & Plan Note (Signed)
Did require procedure to remove one of the stones, passed 1 stone Sees urology as needed

## 2019-02-11 NOTE — Assessment & Plan Note (Signed)
A1c last year 5.8% Encouraged sugar/carbohydrate diet Discussed the role of weight and exercise and sugar control Will check A1c

## 2019-02-11 NOTE — Assessment & Plan Note (Signed)
Experiencing shortness of breath with exertion since having Covid in October-associated with palpitations, chest tightness not feeling in throat Check labs Check chest x-ray Will refer to pulmonary

## 2019-04-20 DIAGNOSIS — M25512 Pain in left shoulder: Secondary | ICD-10-CM | POA: Diagnosis not present

## 2019-04-24 DIAGNOSIS — S46012D Strain of muscle(s) and tendon(s) of the rotator cuff of left shoulder, subsequent encounter: Secondary | ICD-10-CM | POA: Diagnosis not present

## 2019-04-29 DIAGNOSIS — S46012D Strain of muscle(s) and tendon(s) of the rotator cuff of left shoulder, subsequent encounter: Secondary | ICD-10-CM | POA: Diagnosis not present

## 2019-07-01 ENCOUNTER — Other Ambulatory Visit: Payer: Self-pay

## 2019-07-01 ENCOUNTER — Ambulatory Visit: Payer: BC Managed Care – PPO | Admitting: Internal Medicine

## 2019-07-01 ENCOUNTER — Encounter: Payer: Self-pay | Admitting: Internal Medicine

## 2019-07-01 VITALS — BP 122/80 | HR 78 | Temp 98.7°F | Resp 16 | Ht 65.0 in | Wt 172.0 lb

## 2019-07-01 DIAGNOSIS — R6882 Decreased libido: Secondary | ICD-10-CM | POA: Diagnosis not present

## 2019-07-01 NOTE — Assessment & Plan Note (Signed)
Acute Has concerns regarding decreased libido-this is not consistent and seems to wax and wane a little Discussed possibly related to low testosterone, especially since he does have low energy and prediabetes-we will check testosterone level-we will need to be done first thing in the morning Stress could be a factor.  Discussed the importance of stress reduction.  Discussed the importance of going on vacation and getting away with his wife ED is not an issue Discussed the importance of regular exercise, weight loss which may also help Further evaluation depending on testosterone level

## 2019-07-01 NOTE — Patient Instructions (Addendum)
  Blood work was ordered.   This needs to be done before 10 am.     Medications reviewed and updated.  Changes include :   none    Start exercising.  Work on weight loss.  Go on vacation.  Keep your stress level low if possible.

## 2019-07-01 NOTE — Progress Notes (Signed)
Subjective:    Patient ID: Danny Zimmerman, male    DOB: March 27, 1970, 49 y.o.   MRN: [id number]  HPI The patient is here for an acute visit.   He is having issues being intimate with his wife.   He has decreased sex drive.  He is here to evaluate further.  He does admit to having some stress with work and world issues.  He is not sure if that is what is causing some of this.  He just does not have the drive.  He denies any issues with erectile dysfunction.  He states this is something that tends to fluctuate and is not consistent.  He does state low energy.  He is currently not exercising and knows he needs to.  He states him and his wife have not gone away or gone on vacation over the past year or so.  He denies any significant changes in hair growth or muscle mass.   Medications and allergies reviewed with patient and updated if appropriate.  Patient Active Problem List   Diagnosis Date Noted  . History of nephrolithiasis 02/11/2019  . DOE (dyspnea on exertion) 02/11/2019  . Prediabetes 02/10/2019    No current outpatient medications on file prior to visit.   No current facility-administered medications on file prior to visit.    Past Medical History:  Diagnosis Date  . GERD (gastroesophageal reflux disease)   . Ureteral stone     Past Surgical History:  Procedure Laterality Date  . CYSTOSCOPY/RETROGRADE/URETEROSCOPY/STONE EXTRACTION WITH BASKET Left 02/01/2015   Procedure: CYSTOSCOPY/LEFT RETROGRADE/LEFT URETERAL BALLON DILITATION/LEFT /URETEROSCOPY/STONE EXTRACTION WITH BASKET LEFT URETERAL STENT;  Surgeon: Carolan Clines, MD;  Location: [facility];  Service: Urology;  Laterality: Left;    Social History   Socioeconomic History  . Marital status: Married    Spouse name: Not on file  . Number of children: 3  . Years of education: Not on file  . Highest education level: Not on file  Occupational History  . Occupation: Therapist, nutritional    Comment: [employer]    Tobacco Use  . Smoking status: Never Smoker  . Smokeless tobacco: Never Used  Substance and Sexual Activity  . Alcohol use: Yes    Comment: socially  . Drug use: No  . Sexual activity: Yes    Partners: Female  Other Topics Concern  . Not on file  Social History Narrative   Pt lives in 2 story home with his wife and 3 children   High school graduate   Works as Therapist, nutritional with [employer]   Social Determinants of Health   Financial Resource Strain:   . Difficulty of Paying Living Expenses:   Food Insecurity:   . Worried About Charity fundraiser in the Last Year:   . Arboriculturist in the Last Year:   Transportation Needs:   . Film/video editor (Medical):   Marland Kitchen Lack of Transportation (Non-Medical):   Physical Activity:   . Days of Exercise per Week:   . Minutes of Exercise per Session:   Stress:   . Feeling of Stress :   Social Connections:   . Frequency of Communication with Friends and Family:   . Frequency of Social Gatherings with Friends and Family:   . Attends Religious Services:   . Active Member of Clubs or Organizations:   . Attends Archivist Meetings:   Marland Kitchen Marital Status:     Family History  Problem Relation Age of  Onset  . Hyperlipidemia Mother   . Diabetes Paternal Uncle   . Colon cancer Neg Hx   . Esophageal cancer Neg Hx   . Stomach cancer Neg Hx     Review of Systems  Constitutional: Negative for fever.       No energy  Psychiatric/Behavioral: Negative for dysphoric mood. The patient is not nervous/anxious.        Objective:   Vitals:   07/01/19 1354  BP: 122/80  Pulse: 78  Resp: 16  Temp: 98.7 F (37.1 C)  SpO2: 98%   BP Readings from Last 3 Encounters:  07/01/19 122/80  02/11/19 124/84  01/30/18 120/82   Wt Readings from Last 3 Encounters:  07/01/19 172 lb (78 kg)  02/11/19 173 lb 12.8 oz (78.8 kg)  01/30/18 169 lb (76.7 kg)   Body mass index is 28.62 kg/m.   Physical Exam Constitutional:       General: He is not in acute distress.    Appearance: Normal appearance. He is not ill-appearing.     Comments: Slightly overweight  HENT:     Head: Normocephalic and atraumatic.  Skin:    General: Skin is warm and dry.  Neurological:     General: No focal deficit present.     Mental Status: He is alert.  Psychiatric:        Mood and Affect: Mood normal.        Behavior: Behavior normal.        Thought Content: Thought content normal.        Judgment: Judgment normal.            Assessment & Plan:    See Problem List for Assessment and Plan of chronic medical problems.    This visit occurred during the SARS-CoV-2 public health emergency.  Safety protocols were in place, including screening questions prior to the visit, additional usage of staff PPE, and extensive cleaning of exam room while observing appropriate contact time as indicated for disinfecting solutions.

## 2019-07-02 ENCOUNTER — Other Ambulatory Visit: Payer: BC Managed Care – PPO

## 2019-07-02 DIAGNOSIS — R6882 Decreased libido: Secondary | ICD-10-CM

## 2019-07-06 ENCOUNTER — Encounter: Payer: Self-pay | Admitting: Internal Medicine

## 2019-07-06 LAB — TESTOSTERONE, FREE & TOTAL
Free Testosterone: 101.9 pg/mL (ref 35.0–155.0)
Testosterone, Total, LC-MS-MS: 516 ng/dL (ref 250–1100)

## 2019-08-01 DIAGNOSIS — Z20828 Contact with and (suspected) exposure to other viral communicable diseases: Secondary | ICD-10-CM | POA: Diagnosis not present

## 2019-08-01 DIAGNOSIS — Z03818 Encounter for observation for suspected exposure to other biological agents ruled out: Secondary | ICD-10-CM | POA: Diagnosis not present

## 2019-08-11 ENCOUNTER — Telehealth: Payer: Self-pay

## 2019-08-11 DIAGNOSIS — Z20822 Contact with and (suspected) exposure to covid-19: Secondary | ICD-10-CM | POA: Diagnosis not present

## 2019-08-11 DIAGNOSIS — Z03818 Encounter for observation for suspected exposure to other biological agents ruled out: Secondary | ICD-10-CM | POA: Diagnosis not present

## 2019-08-11 NOTE — Telephone Encounter (Signed)
New message    The patient calling his 10 & 47 year old boys tested positive for COVID.   The patient was vaccine [employer] in March & April    The question is can he go back to work since she been exposed if so he will need a note from the MD   Rapid tested done at [facility] - Negative

## 2019-08-11 NOTE — Telephone Encounter (Signed)
Yes, he can return to work since vaccinated.  Letter sent via [employer]

## 2019-08-11 NOTE — Telephone Encounter (Signed)
LVM letting pt know.  

## 2019-12-02 DIAGNOSIS — H40003 Preglaucoma, unspecified, bilateral: Secondary | ICD-10-CM | POA: Diagnosis not present

## 2020-03-07 ENCOUNTER — Telehealth: Payer: Self-pay | Admitting: Internal Medicine

## 2020-03-07 ENCOUNTER — Encounter: Payer: Self-pay | Admitting: Internal Medicine

## 2020-03-07 NOTE — Telephone Encounter (Signed)
Patient called and said that he was exposed to Covid 19 at his job. He has an appt for tomorrow to get a Covid test and was wondering if he can have a note written to excuse him while he is waiting for his results.

## 2020-03-07 NOTE — Telephone Encounter (Signed)
Letter e-mailed today.

## 2020-03-07 NOTE — Telephone Encounter (Signed)
Patient would like it to be emailed to him,  [email or web address]

## 2020-03-07 NOTE — Telephone Encounter (Signed)
yes

## 2020-03-07 NOTE — Telephone Encounter (Signed)
Letter sent to patient today via my-chart. If he is not able to retrieve it from my-chart I asked that he let me know and provide alternate e-mail or fax number for note. He should quarantine until he receives results from test.

## 2020-03-08 DIAGNOSIS — Z20822 Contact with and (suspected) exposure to covid-19: Secondary | ICD-10-CM | POA: Diagnosis not present

## 2020-03-08 NOTE — Telephone Encounter (Signed)
A note has been made in the patient's charts whom these forms are about.   Forms have been completed and placed in providers box to review and sign.  Provider out of office until 03/14/20.

## 2021-04-02 ENCOUNTER — Encounter: Payer: Self-pay | Admitting: Internal Medicine

## 2021-04-02 NOTE — Patient Instructions (Addendum)
Blood work was ordered.     Medications changes include :   none  Your prescription(s) have been submitted to your pharmacy. Please take as directed and contact our office if you believe you are having problem(s) with the medication(s).   A referral was ordered for        Someone from their office will call you to schedule an appointment.    Please followup in 1 year   Health Maintenance, Male Adopting a healthy lifestyle and getting preventive care are important in promoting health and wellness. Ask your health care provider about: The right schedule for you to have regular tests and exams. Things you can do on your own to prevent diseases and keep yourself healthy. What should I know about diet, weight, and exercise? Eat a healthy diet  Eat a diet that includes plenty of vegetables, fruits, low-fat dairy products, and lean protein. Do not eat a lot of foods that are high in solid fats, added sugars, or sodium. Maintain a healthy weight Body mass index (BMI) is a measurement that can be used to identify possible weight problems. It estimates body fat based on height and weight. Your health care provider can help determine your BMI and help you achieve or maintain a healthy weight. Get regular exercise Get regular exercise. This is one of the most important things you can do for your health. Most adults should: Exercise for at least 150 minutes each week. The exercise should increase your heart rate and make you sweat (moderate-intensity exercise). Do strengthening exercises at least twice a week. This is in addition to the moderate-intensity exercise. Spend less time sitting. Even light physical activity can be beneficial. Watch cholesterol and blood lipids Have your blood tested for lipids and cholesterol at 49 years of age, then have this test every 5 years. You may need to have your cholesterol levels checked more often if: Your lipid or cholesterol levels are high. You are  older than 49 years of age. You are at high risk for heart disease. What should I know about cancer screening? Many types of cancers can be detected early and may often be prevented. Depending on your health history and family history, you may need to have cancer screening at various ages. This may include screening for: Colorectal cancer. Prostate cancer. Skin cancer. Lung cancer. What should I know about heart disease, diabetes, and high blood pressure? Blood pressure and heart disease High blood pressure causes heart disease and increases the risk of stroke. This is more likely to develop in people who have high blood pressure readings or are overweight. Talk with your health care provider about your target blood pressure readings. Have your blood pressure checked: Every 3-5 years if you are 58-58 years of age. Every year if you are 78 years old or older. If you are between the ages of 75 and 46 and are a current or former smoker, ask your health care provider if you should have a one-time screening for abdominal aortic aneurysm (AAA). Diabetes Have regular diabetes screenings. This checks your fasting blood sugar level. Have the screening done: Once every three years after age 100 if you are at a normal weight and have a low risk for diabetes. More often and at a younger age if you are overweight or have a high risk for diabetes. What should I know about preventing infection? Hepatitis B If you have a higher risk for hepatitis B, you should be screened for this virus. Talk  with your health care provider to find out if you are at risk for hepatitis B infection. Hepatitis C Blood testing is recommended for: Everyone born from [birth year] through 1963. Anyone with known risk factors for hepatitis C. Sexually transmitted infections (STIs) You should be screened each year for STIs, including gonorrhea and chlamydia, if: You are sexually active and are younger than 48 years of age. You are older  than 49 years of age and your health care provider tells you that you are at risk for this type of infection. Your sexual activity has changed since you were last screened, and you are at increased risk for chlamydia or gonorrhea. Ask your health care provider if you are at risk. Ask your health care provider about whether you are at high risk for HIV. Your health care provider may recommend a prescription medicine to help prevent HIV infection. If you choose to take medicine to prevent HIV, you should first get tested for HIV. You should then be tested every 3 months for as long as you are taking the medicine. Follow these instructions at home: Alcohol use Do not drink alcohol if your health care provider tells you not to drink. If you drink alcohol: Limit how much you have to 0-2 drinks a day. Know how much alcohol is in your drink. In the [place] one drink equals one 12 oz bottle of beer (355 mL), one 5 oz glass of wine (148 mL), or one 1 oz glass of hard liquor (44 mL). Lifestyle Do not use any products that contain nicotine or tobacco. These products include cigarettes, chewing tobacco, and vaping devices, such as e-cigarettes. If you need help quitting, ask your health care provider. Do not use street drugs. Do not share needles. Ask your health care provider for help if you need support or information about quitting drugs. General instructions Schedule regular health, dental, and eye exams. Stay current with your vaccines. Tell your health care provider if: You often feel depressed. You have ever been abused or do not feel safe at home. Summary Adopting a healthy lifestyle and getting preventive care are important in promoting health and wellness. Follow your health care provider's instructions about healthy diet, exercising, and getting tested or screened for diseases. Follow your health care provider's instructions on monitoring your cholesterol and blood pressure. This information is  not intended to replace advice given to you by your health care provider. Make sure you discuss any questions you have with your health care provider. Document Revised: 07/18/2020 Document Reviewed: 07/18/2020 Elsevier Patient Education  [place].

## 2021-04-02 NOTE — Progress Notes (Signed)
Subjective:    Patient ID: Danny Zimmerman, male    DOB: 07/03/1970, 51 y.o.   MRN: [id number]   This visit occurred during the SARS-CoV-2 public health emergency.  Safety protocols were in place, including screening questions prior to the visit, additional usage of staff PPE, and extensive cleaning of exam room while observing appropriate contact time as indicated for disinfecting solutions.   HPI He is here for a physical exam.     Medications and allergies reviewed with patient and updated if appropriate.  Patient Active Problem List   Diagnosis Date Noted   Right knee pain 04/12/2021   History of nephrolithiasis 02/11/2019   Prediabetes 02/10/2019    No current outpatient medications on file prior to visit.   No current facility-administered medications on file prior to visit.    Past Medical History:  Diagnosis Date   GERD (gastroesophageal reflux disease)    Ureteral stone     Past Surgical History:  Procedure Laterality Date   CYSTOSCOPY/RETROGRADE/URETEROSCOPY/STONE EXTRACTION WITH BASKET Left 02/01/2015   Procedure: CYSTOSCOPY/LEFT RETROGRADE/LEFT URETERAL BALLON DILITATION/LEFT /URETEROSCOPY/STONE EXTRACTION WITH BASKET LEFT URETERAL STENT;  Surgeon: Carolan Clines, MD;  Location: [facility];  Service: Urology;  Laterality: Left;    Social History   Socioeconomic History   Marital status: Married    Spouse name: Not on file   Number of children: 3   Years of education: Not on file   Highest education level: Not on file  Occupational History   Occupation: Therapist, nutritional    Comment: [employer]  Tobacco Use   Smoking status: Never   Smokeless tobacco: Never  Vaping Use   Vaping Use: Never used  Substance and Sexual Activity   Alcohol use: Yes    Comment: socially   Drug use: No   Sexual activity: Yes    Partners: Female  Other Topics Concern   Not on file  Social History Narrative   Pt lives in 2 story home with his wife and  3 children   High school graduate   Works as Therapist, 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 There were no vitals filed for this visit. There is no height or weight on file to calculate BMI.  BP Readings from Last 3 Encounters:  04/12/21 124/82  07/01/19 122/80  02/11/19 124/84    Wt Readings from Last 3 Encounters:  04/12/21 183 lb (83 kg)  07/01/19 172 lb (78 kg)  02/11/19 173 lb 12.8 oz (78.8 kg)     Physical Exam Constitutional: He appears well-developed and well-nourished. No distress.  HENT:  Head: Normocephalic and atraumatic.  Right Ear: External ear normal.  Left Ear: External ear normal.  Mouth/Throat: Oropharynx is clear and moist.  Normal ear canals and TM b/l  Eyes: Conjunctivae and EOM are normal.  Neck: Neck supple. No tracheal deviation present. No thyromegaly present.  No carotid bruit  Cardiovascular: Normal rate, regular rhythm, normal heart sounds and intact distal pulses.   No murmur heard. Pulmonary/Chest: Effort normal and breath sounds normal. No respiratory distress. He has no wheezes. He has no rales.  Abdominal: Soft. He exhibits no distension. There is no tenderness.  Genitourinary: deferred  Musculoskeletal: He exhibits no edema.  Lymphadenopathy:   He has no cervical adenopathy.  Skin: Skin is warm and dry. He is not diaphoretic.  Psychiatric: He has a normal mood and affect. His behavior is normal.         Assessment & Plan:   Physical exam: Screening  blood work  ordered Exercise    Weight   Substance abuse   none   Reviewed recommended immunizations.   Health Maintenance  Topic Date Due   COLONOSCOPY (Pts 45-42yrs Insurance coverage will need to be confirmed)  Never done   COVID-19 Vaccine (1) 04/28/2021 (Originally 08/13/1971)   INFLUENZA VACCINE  06/09/2021 (Originally 10/10/2020)   TETANUS/TDAP  01/31/2028   Hepatitis C Screening  Completed   HIV Screening  Completed   HPV VACCINES  Aged Out     See Problem List for Assessment and Plan of chronic medical problems.    This encounter was created in error - please disregard.

## 2021-04-05 ENCOUNTER — Encounter: Payer: BC Managed Care – PPO | Admitting: Internal Medicine

## 2021-04-05 ENCOUNTER — Other Ambulatory Visit: Payer: Self-pay

## 2021-04-05 DIAGNOSIS — Z0000 Encounter for general adult medical examination without abnormal findings: Secondary | ICD-10-CM

## 2021-04-05 DIAGNOSIS — R7303 Prediabetes: Secondary | ICD-10-CM

## 2021-04-05 NOTE — Assessment & Plan Note (Signed)
Chronic Check a1c Low sugar / carb diet Stressed regular exercise  

## 2021-04-11 NOTE — Progress Notes (Signed)
Subjective:    Patient ID: Danny Zimmerman, male    DOB: 07/22/1970, 51 y.o.   MRN: [id number]   This visit occurred during the SARS-CoV-2 public health emergency.  Safety protocols were in place, including screening questions prior to the visit, additional usage of staff PPE, and extensive cleaning of exam room while observing appropriate contact time as indicated for disinfecting solutions.   HPI He is here for a physical exam.   Having right knee pain - saw someone and was prescribed an anti-inflammatory.  He is seeing ortho.   Medications and allergies reviewed with patient and updated if appropriate.  Patient Active Problem List   Diagnosis Date Noted   Right knee pain 04/12/2021   History of nephrolithiasis 02/11/2019   Prediabetes 02/10/2019    Current Outpatient Medications on File Prior to Visit  Medication Sig Dispense Refill   meloxicam (MOBIC) 15 MG tablet Take 15 mg by mouth daily.     No current facility-administered medications on file prior to visit.    Past Medical History:  Diagnosis Date   GERD (gastroesophageal reflux disease)    Ureteral stone     Past Surgical History:  Procedure Laterality Date   CYSTOSCOPY/RETROGRADE/URETEROSCOPY/STONE EXTRACTION WITH BASKET Left 02/01/2015   Procedure: CYSTOSCOPY/LEFT RETROGRADE/LEFT URETERAL BALLON DILITATION/LEFT /URETEROSCOPY/STONE EXTRACTION WITH BASKET LEFT URETERAL STENT;  Surgeon: Carolan Clines, MD;  Location: [facility];  Service: Urology;  Laterality: Left;    Social History   Socioeconomic History   Marital status: Married    Spouse name: Not on file   Number of children: 3   Years of education: Not on file   Highest education level: Not on file  Occupational History   Occupation: Therapist, nutritional    Comment: [employer]  Tobacco Use   Smoking status: Never   Smokeless tobacco: Never  Vaping Use   Vaping Use: Never used  Substance and Sexual Activity   Alcohol use: Yes    Comment: socially    Drug use: No   Sexual activity: Yes    Partners: Female  Other Topics Concern   Not on file  Social History Narrative   Pt lives in 2 story home with his wife and 3 children   High school graduate   Works as Therapist, nutritional with [employer]   Social Determinants of Health   Financial Resource Strain: Not on file  Food Insecurity: Not on file  Transportation Needs: Not on file  Physical Activity: Not on file  Stress: Not on file  Social Connections: Not on file    Family History  Problem Relation Age of Onset   Hyperlipidemia Mother    Diabetes Paternal Uncle    Colon cancer Neg Hx    Esophageal cancer Neg Hx    Stomach cancer Neg Hx     Review of Systems  Constitutional:  Negative for chills and fever.  Eyes:  Negative for visual disturbance.  Respiratory:  Negative for cough, shortness of breath and wheezing.   Cardiovascular:  Negative for chest pain, palpitations and leg swelling.  Gastrointestinal:  Negative for abdominal pain, blood in stool, constipation, diarrhea and nausea.       No gerd  Genitourinary:  Negative for difficulty urinating and dysuria.  Musculoskeletal:  Positive for arthralgias (right knee). Negative for back pain.  Skin:  Positive for color change.  Neurological:  Negative for light-headedness and headaches.  Psychiatric/Behavioral:  The patient is nervous/anxious.       Objective:  Vitals:   04/12/21 0855  BP: 124/82  Pulse: 97  Temp: 98.4 F (36.9 C)  SpO2: 97%   Filed Weights   04/12/21 0855  Weight: 183 lb (83 kg)   Body mass index is 30.45 kg/m.  BP Readings from Last 3 Encounters:  04/12/21 124/82  07/01/19 122/80  02/11/19 124/84    Wt Readings from Last 3 Encounters:  04/12/21 183 lb (83 kg)  07/01/19 172 lb (78 kg)  02/11/19 173 lb 12.8 oz (78.8 kg)     Physical Exam Constitutional: He appears well-developed and well-nourished. No distress.  HENT:  Head: Normocephalic and atraumatic.  Right Ear: External  ear normal.  Left Ear: External ear normal.  Mouth/Throat: Oropharynx is clear and moist.  Normal ear canals and TM b/l  Eyes: Conjunctivae and EOM are normal.  Neck: Neck supple. No tracheal deviation present. No thyromegaly present.  No carotid bruit  Cardiovascular: Normal rate, regular rhythm, normal heart sounds and intact distal pulses.   No murmur heard. Pulmonary/Chest: Effort normal and breath sounds normal. No respiratory distress. He has no wheezes. He has no rales.  Abdominal: Soft. He exhibits no distension. There is no tenderness.  Genitourinary: deferred  Musculoskeletal: He exhibits no edema.  Lymphadenopathy:   He has no cervical adenopathy.  Skin: Skin is warm and dry. He is not diaphoretic.  An area on the medial right knee and front of his left knee that is hyperpigmented and several freckle like rash on his anterior right forearm that is hyperpigmented.  Asymptomatic and benign appearing Psychiatric: He has a normal mood and affect. His behavior is normal.         Assessment & Plan:   Physical exam: Screening blood work  ordered Exercise   not regular due to knee pain Weight  encouraged weight loss.  Doing better with his diet Substance abuse   none   Screened for depression using the PHQ 9 scale.  No evidence of depression.   Screened for anxiety using GAD7 Scale.  His score was 5 indicating some possible mild anxiety.  He states he does have some mild anxiety related to work, family and worrying about things going on in the world.  He does not feel like he has any issues or needs to be on any treatment for this.  He feels most of his anxiety is within normal limits.    Reviewed recommended immunizations.   Health Maintenance  Topic Date Due   Hepatitis C Screening  Never done   COLONOSCOPY (Pts 45-53yrs Insurance coverage will need to be confirmed)  Never done   COVID-19 Vaccine (1) 04/28/2021 (Originally 08/13/1971)   INFLUENZA VACCINE  06/09/2021  (Originally 10/10/2020)   TETANUS/TDAP  01/31/2028   HIV Screening  Completed   HPV VACCINES  Aged Out   Referred to GI for colonoscopy  See Problem List for Assessment and Plan of chronic medical problems.

## 2021-04-11 NOTE — Patient Instructions (Addendum)
Blood work was ordered.     Medications changes include :   none   A referral was ordered for [facility] - Phone: [phone].      Someone from their office will call you to schedule an appointment.    Please followup in 1 year    Health Maintenance, Male Adopting a healthy lifestyle and getting preventive care are important in promoting health and wellness. Ask your health care provider about: The right schedule for you to have regular tests and exams. Things you can do on your own to prevent diseases and keep yourself healthy. What should I know about diet, weight, and exercise? Eat a healthy diet  Eat a diet that includes plenty of vegetables, fruits, low-fat dairy products, and lean protein. Do not eat a lot of foods that are high in solid fats, added sugars, or sodium. Maintain a healthy weight Body mass index (BMI) is a measurement that can be used to identify possible weight problems. It estimates body fat based on height and weight. Your health care provider can help determine your BMI and help you achieve or maintain a healthy weight. Get regular exercise Get regular exercise. This is one of the most important things you can do for your health. Most adults should: Exercise for at least 150 minutes each week. The exercise should increase your heart rate and make you sweat (moderate-intensity exercise). Do strengthening exercises at least twice a week. This is in addition to the moderate-intensity exercise. Spend less time sitting. Even light physical activity can be beneficial. Watch cholesterol and blood lipids Have your blood tested for lipids and cholesterol at 49 years of age, then have this test every 5 years. You may need to have your cholesterol levels checked more often if: Your lipid or cholesterol levels are high. You are older than 49 years of age. You are at high risk for heart disease. What should I know about cancer screening? Many types of cancers can  be detected early and may often be prevented. Depending on your health history and family history, you may need to have cancer screening at various ages. This may include screening for: Colorectal cancer. Prostate cancer. Skin cancer. Lung cancer. What should I know about heart disease, diabetes, and high blood pressure? Blood pressure and heart disease High blood pressure causes heart disease and increases the risk of stroke. This is more likely to develop in people who have high blood pressure readings or are overweight. Talk with your health care provider about your target blood pressure readings. Have your blood pressure checked: Every 3-5 years if you are 37-22 years of age. Every year if you are 8 years old or older. If you are between the ages of 38 and 23 and are a current or former smoker, ask your health care provider if you should have a one-time screening for abdominal aortic aneurysm (AAA). Diabetes Have regular diabetes screenings. This checks your fasting blood sugar level. Have the screening done: Once every three years after age 64 if you are at a normal weight and have a low risk for diabetes. More often and at a younger age if you are overweight or have a high risk for diabetes. What should I know about preventing infection? Hepatitis B If you have a higher risk for hepatitis B, you should be screened for this virus. Talk with your health care provider to find out if you are at risk for hepatitis B infection. Hepatitis C Blood testing is  recommended for: Everyone born from [birth year] through 1963. Anyone with known risk factors for hepatitis C. Sexually transmitted infections (STIs) You should be screened each year for STIs, including gonorrhea and chlamydia, if: You are sexually active and are younger than 49 years of age. You are older than 49 years of age and your health care provider tells you that you are at risk for this type of infection. Your sexual activity has  changed since you were last screened, and you are at increased risk for chlamydia or gonorrhea. Ask your health care provider if you are at risk. Ask your health care provider about whether you are at high risk for HIV. Your health care provider may recommend a prescription medicine to help prevent HIV infection. If you choose to take medicine to prevent HIV, you should first get tested for HIV. You should then be tested every 3 months for as long as you are taking the medicine. Follow these instructions at home: Alcohol use Do not drink alcohol if your health care provider tells you not to drink. If you drink alcohol: Limit how much you have to 0-2 drinks a day. Know how much alcohol is in your drink. In the [place] one drink equals one 12 oz bottle of beer (355 mL), one 5 oz glass of wine (148 mL), or one 1 oz glass of hard liquor (44 mL). Lifestyle Do not use any products that contain nicotine or tobacco. These products include cigarettes, chewing tobacco, and vaping devices, such as e-cigarettes. If you need help quitting, ask your health care provider. Do not use street drugs. Do not share needles. Ask your health care provider for help if you need support or information about quitting drugs. General instructions Schedule regular health, dental, and eye exams. Stay current with your vaccines. Tell your health care provider if: You often feel depressed. You have ever been abused or do not feel safe at home. Summary Adopting a healthy lifestyle and getting preventive care are important in promoting health and wellness. Follow your health care provider's instructions about healthy diet, exercising, and getting tested or screened for diseases. Follow your health care provider's instructions on monitoring your cholesterol and blood pressure. This information is not intended to replace advice given to you by your health care provider. Make sure you discuss any questions you have with your health  care provider. Document Revised: 07/18/2020 Document Reviewed: 07/18/2020 Elsevier Patient Education  [place].

## 2021-04-12 ENCOUNTER — Encounter: Payer: Self-pay | Admitting: Internal Medicine

## 2021-04-12 ENCOUNTER — Ambulatory Visit (INDEPENDENT_AMBULATORY_CARE_PROVIDER_SITE_OTHER): Payer: 59 | Admitting: Internal Medicine

## 2021-04-12 ENCOUNTER — Other Ambulatory Visit: Payer: Self-pay

## 2021-04-12 VITALS — BP 124/82 | HR 97 | Temp 98.4°F | Ht 65.0 in | Wt 183.0 lb

## 2021-04-12 DIAGNOSIS — Z1211 Encounter for screening for malignant neoplasm of colon: Secondary | ICD-10-CM

## 2021-04-12 DIAGNOSIS — Z0000 Encounter for general adult medical examination without abnormal findings: Secondary | ICD-10-CM

## 2021-04-12 DIAGNOSIS — G8929 Other chronic pain: Secondary | ICD-10-CM

## 2021-04-12 DIAGNOSIS — M25561 Pain in right knee: Secondary | ICD-10-CM | POA: Diagnosis not present

## 2021-04-12 DIAGNOSIS — R7303 Prediabetes: Secondary | ICD-10-CM | POA: Diagnosis not present

## 2021-04-12 DIAGNOSIS — Z0001 Encounter for general adult medical examination with abnormal findings: Secondary | ICD-10-CM

## 2021-04-12 DIAGNOSIS — Z1159 Encounter for screening for other viral diseases: Secondary | ICD-10-CM

## 2021-04-12 LAB — COMPREHENSIVE METABOLIC PANEL
ALT: 30 U/L (ref 0–53)
AST: 26 U/L (ref 0–37)
Albumin: 4.5 g/dL (ref 3.5–5.2)
Alkaline Phosphatase: 82 U/L (ref 39–117)
BUN: 8 mg/dL (ref 6–23)
CO2: 28 mEq/L (ref 19–32)
Calcium: 9.6 mg/dL (ref 8.4–10.5)
Chloride: 102 mEq/L (ref 96–112)
Creatinine, Ser: 0.84 mg/dL (ref 0.40–1.50)
GFR: 103.37 mL/min (ref >60.00)
Glucose, Bld: 96 mg/dL (ref 70–99)
Potassium: 3.9 mEq/L (ref 3.5–5.1)
Sodium: 137 mEq/L (ref 135–145)
Total Bilirubin: 0.9 mg/dL (ref 0.2–1.2)
Total Protein: 8 g/dL (ref 6.0–8.3)

## 2021-04-12 LAB — CBC WITH DIFFERENTIAL/PLATELET
Basophils Absolute: 0 10*3/uL (ref 0.0–0.1)
Basophils Relative: 0.9 % (ref 0.0–3.0)
Eosinophils Absolute: 0.1 10*3/uL (ref 0.0–0.7)
Eosinophils Relative: 1.5 % (ref 0.0–5.0)
HCT: 46.8 % (ref 39.0–52.0)
Hemoglobin: 15.8 g/dL (ref 13.0–17.0)
Lymphocytes Relative: 37.8 % (ref 12.0–46.0)
Lymphs Abs: 2 10*3/uL (ref 0.7–4.0)
MCHC: 33.6 g/dL (ref 30.0–36.0)
MCV: 84.5 fl (ref 78.0–100.0)
Monocytes Absolute: 0.4 10*3/uL (ref 0.1–1.0)
Monocytes Relative: 7.5 % (ref 3.0–12.0)
Neutro Abs: 2.7 10*3/uL (ref 1.4–7.7)
Neutrophils Relative %: 52.3 % (ref 43.0–77.0)
Platelets: 286 10*3/uL (ref 150.0–400.0)
RBC: 5.54 Mil/uL (ref 4.22–5.81)
RDW: 13.1 % (ref 11.5–15.5)
WBC: 5.2 10*3/uL (ref 4.0–10.5)

## 2021-04-12 LAB — LIPID PANEL
Cholesterol: 200 mg/dL (ref 0–200)
HDL: 43.1 mg/dL (ref >39.00)
Total CHOL/HDL Ratio: 5
Triglycerides: 496 mg/dL — ABNORMAL HIGH (ref 0.0–149.0)

## 2021-04-12 LAB — HEPATITIS C ANTIBODY
Hepatitis C Ab: NONREACTIVE
SIGNAL TO CUT-OFF: <0.02 (ref <1.00)

## 2021-04-12 LAB — LDL CHOLESTEROL, DIRECT: Direct LDL: 64 mg/dL

## 2021-04-12 LAB — HEMOGLOBIN A1C: Hgb A1c MFr Bld: 5.9 % (ref 4.6–6.5)

## 2021-04-12 LAB — TSH: TSH: 1.19 u[IU]/mL (ref 0.35–5.50)

## 2021-04-12 NOTE — Assessment & Plan Note (Signed)
Chronic Pain for years - getting worse Seeing ortho Taking meloxicam x few days Will likely need MRI

## 2021-04-12 NOTE — Assessment & Plan Note (Signed)
Chronic Check a1c Low sugar / carb diet Stressed regular exercise  

## 2021-04-25 ENCOUNTER — Encounter: Payer: Self-pay | Admitting: Internal Medicine

## 2021-06-22 ENCOUNTER — Ambulatory Visit (AMBULATORY_SURGERY_CENTER): Payer: 59 | Admitting: *Deleted

## 2021-06-22 VITALS — Ht 65.0 in | Wt 175.0 lb

## 2021-06-22 DIAGNOSIS — Z1211 Encounter for screening for malignant neoplasm of colon: Secondary | ICD-10-CM

## 2021-06-22 MED ORDER — NA SULFATE-K SULFATE-MG SULF 17.5-3.13-1.6 GM/177ML PO SOLN: 1.0000 | Freq: Once | ORAL | 0 refills | Status: AC

## 2021-06-22 NOTE — Progress Notes (Signed)
No egg or soy allergy known to patient  ?No issues known to pt with past sedation with any surgeries or procedures ?Patient denies ever being told they had issues or difficulty with intubation  ?No FH of Malignant Hyperthermia ?Pt is not on diet pills ?Pt is not on  home 02  ?Pt is not on blood thinners  ?Pt denies issues with constipation  ?No A fib or A flutter ? ? NO PA's for preps discussed with pt In PV today  ?Discussed with pt there will be an out-of-pocket cost for prep and that varies from $0 to 70 +  dollars - pt verbalized understanding  ?Pt instructed to use Singlecare.com or GoodRx for a price reduction on prep  ? ?PV completed over the phone. Pt verified name, DOB, address and insurance during PV today.  ?Pt mailed instruction packet with copy of consent form to read and not return, and instructions.  ?Pt encouraged to call with questions or issues.  ?If pt has My chart, procedure instructions sent via My Chart  ? ?

## 2021-07-05 ENCOUNTER — Encounter: Payer: Self-pay | Admitting: Gastroenterology

## 2021-07-13 ENCOUNTER — Ambulatory Visit (AMBULATORY_SURGERY_CENTER): Payer: 59 | Admitting: Gastroenterology

## 2021-07-13 ENCOUNTER — Encounter: Payer: Self-pay | Admitting: Gastroenterology

## 2021-07-13 VITALS — BP 119/83 | HR 56 | Temp 96.6°F | Resp 19 | Ht 65.0 in | Wt 175.0 lb

## 2021-07-13 DIAGNOSIS — D123 Benign neoplasm of transverse colon: Secondary | ICD-10-CM | POA: Diagnosis not present

## 2021-07-13 DIAGNOSIS — Z1211 Encounter for screening for malignant neoplasm of colon: Secondary | ICD-10-CM | POA: Diagnosis not present

## 2021-07-13 MED ORDER — SODIUM CHLORIDE 0.9 % IV SOLN: 500.0000 mL | Freq: Once | INTRAVENOUS | Status: DC

## 2021-07-13 NOTE — Progress Notes (Signed)
Pt's states no medical or surgical changes since previsit or office visit. 

## 2021-07-13 NOTE — Op Note (Signed)
[place] ?Patient Name: Danny Zimmerman ?Procedure Date: 07/13/2021 7:59 AM ?MRN: [id number] ?Endoscopist: Ladene Artist , MD ?Age: 51 ?Referring MD:  ?Date of Birth: October 07, 1970 ?Gender: Male ?Account #: [id number] ?Procedure:                Colonoscopy ?Indications:              Screening for colorectal malignant neoplasm ?Medicines:                Monitored Anesthesia Care ?Procedure:                Pre-Anesthesia Assessment: ?                          - Prior to the procedure, a History and Physical  ?                          was performed, and patient medications and  ?                          allergies were reviewed. The patient's tolerance of  ?                          previous anesthesia was also reviewed. The risks  ?                          and benefits of the procedure and the sedation  ?                          options and risks were discussed with the patient.  ?                          All questions were answered, and informed consent  ?                          was obtained. Prior Anticoagulants: The patient has  ?                          taken no previous anticoagulant or antiplatelet  ?                          agents. ASA Grade Assessment: II - A patient with  ?                          mild systemic disease. After reviewing the risks  ?                          and benefits, the patient was deemed in  ?                          satisfactory condition to undergo the procedure. ?                          After obtaining informed consent, the colonoscope  ?  was passed under direct vision. Throughout the  ?                          procedure, the patient's blood pressure, pulse, and  ?                          oxygen saturations were monitored continuously. The  ?                          Olympus CF-[id number] (#[id number]) Colonoscope was  ?                          introduced through the anus and advanced to the the  ?                          cecum, identified by  appendiceal orifice and  ?                          ileocecal valve. The ileocecal valve, appendiceal  ?                          orifice, and rectum were photographed. The quality  ?                          of the bowel preparation was good. The colonoscopy  ?                          was performed without difficulty. The patient  ?                          tolerated the procedure well. ?Scope In: 8:03:41 AM ?Scope Out: 8:17:03 AM ?Scope Withdrawal Time: 0 hours 11 minutes 22 seconds  ?Total Procedure Duration: 0 hours 13 minutes 22 seconds  ?Findings:                 The perianal and digital rectal examinations were  ?                          normal. ?                          Two sessile polyps were found in the transverse  ?                          colon. The polyps were 6 to 7 mm in size. These  ?                          polyps were removed with a cold snare. Resection  ?                          and retrieval were complete. ?                          The exam was otherwise without abnormality on  ?  direct and retroflexion views. ?Complications:            No immediate complications. Estimated blood loss:  ?                          None. ?Estimated Blood Loss:     Estimated blood loss: none. ?Impression:               - Two 6 to 7 mm polyps in the transverse colon,  ?                          removed with a cold snare. Resected and retrieved. ?                          - The examination was otherwise normal on direct  ?                          and retroflexion views. ?Recommendation:           - Repeat colonoscopy after studies are complete for  ?                          surveillance based on pathology results. ?                          - Patient has a contact number available for  ?                          emergencies. The signs and symptoms of potential  ?                          delayed complications were discussed with the  ?                          patient. Return to  normal activities tomorrow.  ?                          Written discharge instructions were provided to the  ?                          patient. ?                          - Resume previous diet. ?                          - Continue present medications. ?                          - Await pathology results. ?Ladene Artist, MD ?07/13/2021 8:19:49 AM ?This report has been signed electronically. ?

## 2021-07-13 NOTE — Patient Instructions (Signed)
Information on polyps given to you today.  Await pathology results.  Resume previous diet and medications.  YOU HAD AN ENDOSCOPIC PROCEDURE TODAY AT THE [facility]:   Refer to the procedure report that was given to you for any specific questions about what was found during the examination.  If the procedure report does not answer your questions, please call your gastroenterologist to clarify.  If you requested that your care partner not be given the details of your procedure findings, then the procedure report has been included in a sealed envelope for you to review at your convenience later.  YOU SHOULD EXPECT: Some feelings of bloating in the abdomen. Passage of more gas than usual.  Walking can help get rid of the air that was put into your GI tract during the procedure and reduce the bloating. If you had a lower endoscopy (such as a colonoscopy or flexible sigmoidoscopy) you may notice spotting of blood in your stool or on the toilet paper. If you underwent a bowel prep for your procedure, you may not have a normal bowel movement for a few days.  Please Note:  You might notice some irritation and congestion in your nose or some drainage.  This is from the oxygen used during your procedure.  There is no need for concern and it should clear up in a day or so.  SYMPTOMS TO REPORT IMMEDIATELY:   Following lower endoscopy (colonoscopy or flexible sigmoidoscopy):  Excessive amounts of blood in the stool  Significant tenderness or worsening of abdominal pains  Swelling of the abdomen that is new, acute  Fever of 100F or higher   For urgent or emergent issues, a gastroenterologist can be reached at any hour by calling [phone]. Do not use MyChart messaging for urgent concerns.    DIET:  We do recommend a small meal at first, but then you may proceed to your regular diet.  Drink plenty of fluids but you should avoid alcoholic beverages for 24 hours.  ACTIVITY:  You should  plan to take it easy for the rest of today and you should NOT DRIVE or use heavy machinery until tomorrow (because of the sedation medicines used during the test).    FOLLOW UP: Our staff will call the number listed on your records 48-72 hours following your procedure to check on you and address any questions or concerns that you may have regarding the information given to you following your procedure. If we do not reach you, we will leave a message.  We will attempt to reach you two times.  During this call, we will ask if you have developed any symptoms of COVID 19. If you develop any symptoms (ie: fever, flu-like symptoms, shortness of breath, cough etc.) before then, please call [phone].  If you test positive for Covid 19 in the 2 weeks post procedure, please call and report this information to us.    If any biopsies were taken you will be contacted by phone or by letter within the next 1-3 weeks.  Please call us at [phone] if you have not heard about the biopsies in 3 weeks.    SIGNATURES/CONFIDENTIALITY: You and/or your care partner have signed paperwork which will be entered into your electronic medical record.  These signatures attest to the fact that that the information above on your After Visit Summary has been reviewed and is understood.  Full responsibility of the confidentiality of this discharge information lies with you and/or your care-partner. 

## 2021-07-13 NOTE — Progress Notes (Signed)
Report to PACU, RN, vss, BBS= Clear.  

## 2021-07-13 NOTE — Progress Notes (Signed)
? ?History & Physical ? ?Primary Care Physician:  Binnie Rail, MD ?Primary Gastroenterologist: Lucio Edward, MD ? ?CHIEF COMPLAINT:  CRC screening, ? ?HPI: Danny Zimmerman is a 51 y.o. male for CRC screening, average risk, with colonoscopy. ? ? ?Past Medical History:  ?Diagnosis Date  ? Allergy   ? pollen  ? GERD (gastroesophageal reflux disease)   ? History of kidney stones   ? Ureteral stone   ? ? ?Past Surgical History:  ?Procedure Laterality Date  ? CYSTOSCOPY/RETROGRADE/URETEROSCOPY/STONE EXTRACTION WITH BASKET Left 02/01/2015  ? Procedure: CYSTOSCOPY/LEFT RETROGRADE/LEFT URETERAL BALLON DILITATION/LEFT /URETEROSCOPY/STONE EXTRACTION WITH BASKET LEFT URETERAL STENT;  Surgeon: Carolan Clines, MD;  Location: [facility];  Service: Urology;  Laterality: Left;  ? UPPER GASTROINTESTINAL ENDOSCOPY  2018  ? ? ?Prior to Admission medications   ?Medication Sig Start Date End Date Taking? Authorizing Provider  ?ibuprofen (ADVIL) 200 MG tablet Take 200 mg by mouth every 6 (six) hours as needed.   Yes [provider]  ? ? ?Current Outpatient Medications  ?Medication Sig Dispense Refill  ? ibuprofen (ADVIL) 200 MG tablet Take 200 mg by mouth every 6 (six) hours as needed.    ? ?Current Facility-Administered Medications  ?Medication Dose Route Frequency Provider Last Rate Last Admin  ? 0.9 %  sodium chloride infusion  500 mL Intravenous Once Ladene Artist, MD      ? ? ?Allergies as of 07/13/2021  ? (No Known Allergies)  ? ? ?Family History  ?Problem Relation Age of Onset  ? Hyperlipidemia Mother   ? Colon cancer Maternal Aunt   ? Diabetes Paternal Uncle   ? Esophageal cancer Neg Hx   ? Stomach cancer Neg Hx   ? Colon polyps Neg Hx   ? Rectal cancer Neg Hx   ? ? ?Social History  ? ?Socioeconomic History  ? Marital status: Married  ?  Spouse name: Not on file  ? Number of children: 3  ? Years of education: Not on file  ? Highest education level: Not on file  ?Occupational History  ? Occupation: Therapist, nutritional  ?   Comment: [employer]  ?Tobacco Use  ? Smoking status: Never  ? Smokeless tobacco: Never  ?Vaping Use  ? Vaping Use: Never used  ?Substance and Sexual Activity  ? Alcohol use: Yes  ?  Comment: socially  ? Drug use: No  ? Sexual activity: Yes  ?  Partners: Female  ?Other Topics Concern  ? Not on file  ?Social History Narrative  ? Pt lives in 2 story home with his wife and 3 children  ? High school graduate  ? Works as Therapist, nutritional with [employer]  ? ?Social Determinants of Health  ? ?Financial Resource Strain: Not on file  ?Food Insecurity: Not on file  ?Transportation Needs: Not on file  ?Physical Activity: Not on file  ?Stress: Not on file  ?Social Connections: Not on file  ?Intimate Partner Violence: Not on file  ? ? ?Review of Systems: ? ?All systems reviewed an negative except where noted in HPI. ? ?Gen: Denies any fever, chills, sweats, anorexia, fatigue, weakness, malaise, weight loss, and sleep disorder ?CV: Denies chest pain, angina, palpitations, syncope, orthopnea, PND, peripheral edema, and claudication. ?Resp: Denies dyspnea at rest, dyspnea with exercise, cough, sputum, wheezing, coughing up blood, and pleurisy. ?GI: Denies vomiting blood, jaundice, and fecal incontinence.   Denies dysphagia or odynophagia. ?GU : Denies urinary burning, blood in urine, urinary frequency, urinary hesitancy, nocturnal urination, and urinary  incontinence. ?MS: Denies joint pain, limitation of movement, and swelling, stiffness, low back pain, extremity pain. Denies muscle weakness, cramps, atrophy.  ?Derm: Denies rash, itching, dry skin, hives, moles, warts, or unhealing ulcers.  ?Psych: Denies depression, anxiety, memory loss, suicidal ideation, hallucinations, paranoia, and confusion. ?Heme: Denies bruising, bleeding, and enlarged lymph nodes. ?Neuro:  Denies any headaches, dizziness, paresthesias. ?Endo:  Denies any problems with DM, thyroid, adrenal function. ? ? ?Physical Exam: ?General:  Alert, well-developed, in  NAD ?Head:  Normocephalic and atraumatic. ?Eyes:  Sclera clear, no icterus.   Conjunctiva pink. ?Ears:  Normal auditory acuity. ?Mouth:  No deformity or lesions.  ?Neck:  Supple; no masses . ?Lungs:  Clear throughout to auscultation.   No wheezes, crackles, or rhonchi. No acute distress. ?Heart:  Regular rate and rhythm; no murmurs. ?Abdomen:  Soft, nondistended, nontender. No masses, hepatomegaly. No obvious masses.  Normal bowel .    ?Rectal:  Deferred   ?Msk:  Symmetrical without gross deformities.Marland Kitchen ?Pulses:  Normal pulses noted. ?Extremities:  Without edema. ?Neurologic:  Alert and  oriented x4;  grossly normal neurologically. ?Skin:  Intact without significant lesions or rashes. ?Cervical Nodes:  No significant cervical adenopathy. ?Psych:  Alert and cooperative. Normal mood and affect. ? ? ?Impression / Plan:  ? ?CRC screening, average risk, for colonoscopy. ? ?Basheer Molchan T. Fuller Plan  07/13/2021, 8:01 AM ?See Shea Evans, [facility], to contact our on call provider ? ? ?  ?

## 2021-07-13 NOTE — Progress Notes (Signed)
Called to room to assist during endoscopic procedure.  Patient ID and intended procedure confirmed with present staff. Received instructions for my participation in the procedure from the performing physician.  

## 2021-07-17 ENCOUNTER — Telehealth: Payer: Self-pay | Admitting: *Deleted

## 2021-07-17 ENCOUNTER — Telehealth: Payer: Self-pay

## 2021-07-17 NOTE — Telephone Encounter (Signed)
Left message on follow up call. 

## 2021-07-17 NOTE — Telephone Encounter (Signed)
Attempted to call patient for their post-procedure follow-up call. No answer. Left voicemail.   

## 2021-07-24 ENCOUNTER — Encounter: Payer: Self-pay | Admitting: Gastroenterology

## 2022-04-13 ENCOUNTER — Ambulatory Visit (HOSPITAL_COMMUNITY)
Admission: EM | Admit: 2022-04-13 | Discharge: 2022-04-13 | Disposition: A | Discharge status: Home / Self Care | Payer: 59

## 2022-04-13 ENCOUNTER — Other Ambulatory Visit: Payer: Self-pay

## 2022-04-13 ENCOUNTER — Encounter (HOSPITAL_COMMUNITY): Payer: Self-pay

## 2022-04-13 ENCOUNTER — Emergency Department (HOSPITAL_COMMUNITY): Payer: Managed Care, Other (non HMO)

## 2022-04-13 ENCOUNTER — Emergency Department (HOSPITAL_COMMUNITY)
Admission: EM | Admit: 2022-04-13 | Discharge: 2022-04-13 | Disposition: A | Discharge status: Home / Self Care | Payer: Managed Care, Other (non HMO) | Attending: Emergency Medicine | Admitting: Emergency Medicine

## 2022-04-13 DIAGNOSIS — S0093XA Contusion of unspecified part of head, initial encounter: Secondary | ICD-10-CM

## 2022-04-13 DIAGNOSIS — W010XXA Fall on same level from slipping, tripping and stumbling without subsequent striking against object, initial encounter: Secondary | ICD-10-CM | POA: Diagnosis not present

## 2022-04-13 DIAGNOSIS — S0083XA Contusion of other part of head, initial encounter: Secondary | ICD-10-CM | POA: Diagnosis not present

## 2022-04-13 DIAGNOSIS — S80212A Abrasion, left knee, initial encounter: Secondary | ICD-10-CM

## 2022-04-13 DIAGNOSIS — S8002XA Contusion of left knee, initial encounter: Secondary | ICD-10-CM

## 2022-04-13 DIAGNOSIS — Y92481 Parking lot as the place of occurrence of the external cause: Secondary | ICD-10-CM | POA: Diagnosis not present

## 2022-04-13 DIAGNOSIS — S60222A Contusion of left hand, initial encounter: Secondary | ICD-10-CM

## 2022-04-13 DIAGNOSIS — S0990XA Unspecified injury of head, initial encounter: Secondary | ICD-10-CM | POA: Diagnosis present

## 2022-04-13 DIAGNOSIS — S60419A Abrasion of unspecified finger, initial encounter: Secondary | ICD-10-CM

## 2022-04-13 MED ORDER — ACETAMINOPHEN 325 MG PO TABS
650.0000 mg | ORAL_TABLET | Freq: Four times a day (QID) | ORAL | Status: DC | PRN
  Administered 2022-04-13: 650 mg via ORAL
  Filled 2022-04-13: qty 2

## 2022-04-13 MED ORDER — OXYCODONE-ACETAMINOPHEN 5-325 MG PO TABS
1.0000 | ORAL_TABLET | Freq: Once | ORAL | Status: AC
  Administered 2022-04-13: 1 via ORAL
  Filled 2022-04-13: qty 1

## 2022-04-13 MED ORDER — METHOCARBAMOL 750 MG PO TABS: 750.0000 mg | ORAL_TABLET | Freq: Three times a day (TID) | ORAL | 0 refills | Status: DC | PRN

## 2022-04-13 NOTE — ED Triage Notes (Addendum)
Pt presents with injuries from a ground level fall. Pt states he tripped over uneven ground in a parking lot and he fell against a car. Pt c/o injury to the R side of his head, his L hand, and bilateral knees. Pt denies LOC and is not on anticoagulant.

## 2022-04-13 NOTE — ED Provider Notes (Signed)
[facility] Provider Note   CSN: [id number] Arrival date & time: 04/13/22  2000     History  Chief Complaint  Patient presents with   [facility] is a 52 y.o. male.  Pt presents s/p trip and fall. Was in parking lot, was dark, tripped over median area in parking lot, fell forward, hit head/dazed, pain to area, and also c/o left knee contusion, pain, abrasion and left hand contusion/abrasion. Tetanus up to date. Was asymptomatic prior to trip and fall. No faintness. Denies neck or back pain. No chest pain or sob. No abd pain or nv. No other extremity pain or injury. No anticoag use.   The history is provided by the patient, medical records and the EMS personnel.  Fall Pertinent negatives include no chest pain, no abdominal pain and no shortness of breath.       Home Medications Prior to Admission medications   Medication Sig Start Date End Date Taking? Authorizing Provider  methocarbamol (ROBAXIN) 750 MG tablet Take 1 tablet (750 mg total) by mouth 3 (three) times daily as needed (muscle spasm/pain). 04/13/22  Yes Lajean Saver, MD  ibuprofen (ADVIL) 200 MG tablet Take 200 mg by mouth every 6 (six) hours as needed.    [provider]      Allergies    Patient has no known allergies.    Review of Systems   Review of Systems  Constitutional:  Negative for fever.  HENT:  Negative for nosebleeds.   Eyes:  Negative for pain.  Respiratory:  Negative for shortness of breath.   Cardiovascular:  Negative for chest pain.  Gastrointestinal:  Negative for abdominal pain, nausea and vomiting.  Genitourinary:  Negative for flank pain.  Musculoskeletal:  Negative for back pain and neck pain.  Skin:  Positive for wound.  Neurological:  Negative for weakness and numbness.  Hematological:  Does not bruise/bleed easily.  Psychiatric/Behavioral:  Negative for confusion.     Physical Exam Updated Vital Signs BP (!) 135/103  (BP Location: Right Arm)   Pulse 75   Temp 98 F (36.7 C) (Oral)   Resp 20   Ht 1.651 m ('5\' 5"'$ )   Wt 80.7 kg   SpO2 100%   BMI 29.62 kg/m  Physical Exam Vitals and nursing note reviewed.  Constitutional:      Appearance: Normal appearance. He is well-developed.  HENT:     Head:     Comments: Contusion right forehead    Nose: Nose normal. No congestion or rhinorrhea.     Mouth/Throat:     Mouth: Mucous membranes are moist.  Eyes:     General: No scleral icterus.    Conjunctiva/sclera: Conjunctivae normal.     Pupils: Pupils are equal, round, and reactive to light.  Neck:     Trachea: No tracheal deviation.  Cardiovascular:     Rate and Rhythm: Normal rate and regular rhythm.     Pulses: Normal pulses.     Heart sounds: Normal heart sounds. No murmur heard.    No friction rub. No gallop.  Pulmonary:     Effort: Pulmonary effort is normal. No accessory muscle usage or respiratory distress.     Breath sounds: Normal breath sounds.  Chest:     Chest wall: No tenderness.  Abdominal:     General: There is no distension.     Palpations: Abdomen is soft.     Tenderness: There is no abdominal  tenderness.  Musculoskeletal:        General: No swelling.     Cervical back: Normal range of motion and neck supple. No rigidity or tenderness.     Comments: CTLS spine, non tender, aligned, no step off. Tenderness left knee, knee is grossly stable, no effusion. Superficial abrasion to knee. Contusion/tenderness left hand w superficial abrasion. No focal wrist or scaphoid tenderness. No other focal pain or bony tenderness on bil extremity exam.   Skin:    General: Skin is warm and dry.     Findings: No rash.  Neurological:     Mental Status: He is alert.     Comments: Alert, speech clear. GCS 15. Motor/sens grossly intact bil.   Psychiatric:        Mood and Affect: Mood normal.     ED Results / Procedures / Treatments   Labs (all labs ordered are listed, but only abnormal results  are displayed) Labs Reviewed - No data to display  EKG None  Radiology CT Head Wo Contrast  Result Date: 04/13/2022 CLINICAL DATA:  Head trauma, fall. EXAM: CT HEAD WITHOUT CONTRAST TECHNIQUE: Contiguous axial images were obtained from the base of the skull through the vertex without intravenous contrast. RADIATION DOSE REDUCTION: This exam was performed according to the departmental dose-optimization program which includes automated exposure control, adjustment of the mA and/or kV according to patient size and/or use of iterative reconstruction technique. COMPARISON:  None Available. FINDINGS: Brain: No acute intracranial hemorrhage, midline shift or mass effect. No extra-axial fluid collection. Gray-white matter differentiation is within normal limits. No hydrocephalus. Vascular: No hyperdense vessel or unexpected calcification. Skull: Normal. Negative for fracture or focal lesion. Sinuses/Orbits: No acute finding. Other: None. IMPRESSION: No acute intracranial process. Electronically Signed   By: Brett Fairy M.D.   On: 04/13/2022 21:57   DG Knee Complete 4 Views Left  Result Date: 04/13/2022 CLINICAL DATA:  Left knee pain after a fall. EXAM: LEFT KNEE - COMPLETE 4+ VIEW COMPARISON:  None Available. FINDINGS: No evidence of fracture, dislocation, or joint effusion. No evidence of arthropathy or other focal bone abnormality. Soft tissues are unremarkable. IMPRESSION: Negative. Electronically Signed   By: Lucienne Capers M.D.   On: 04/13/2022 20:57   DG Hand 2 View Left  Result Date: 04/13/2022 CLINICAL DATA:  Left hand pain after a fall. EXAM: LEFT HAND - 2 VIEW COMPARISON:  None Available. FINDINGS: Skin tag or small skin avulsion demonstrated over the tip of the second finger. No evidence of acute fracture or dislocation. No focal bone lesion. Bone cortex appears intact. Joint spaces are normal. No radiopaque soft tissue foreign bodies. IMPRESSION: No acute bony abnormalities. Skin defect or skin  tag over the distal second finger. Electronically Signed   By: Lucienne Capers M.D.   On: 04/13/2022 20:56    Procedures Procedures    Medications Ordered in ED Medications  acetaminophen (TYLENOL) tablet 650 mg (650 mg Oral Given 04/13/22 2015)  oxyCODONE-acetaminophen (PERCOCET/ROXICET) 5-325 MG per tablet 1 tablet (1 tablet Oral Given 04/13/22 2205)    ED Course/ Medical Decision Making/ A&P                             Medical Decision Making Problems Addressed: Abrasion of finger of left hand, initial encounter: acute illness or injury Abrasion, left knee, initial encounter: acute illness or injury Contusion of head, initial encounter: acute illness or injury with systemic symptoms that  poses a threat to life or bodily functions Contusion of left hand, initial encounter: acute illness or injury Contusion of left knee, initial encounter: acute illness or injury Fall from slip, trip, or stumble, initial encounter: acute illness or injury with systemic symptoms that poses a threat to life or bodily functions  Amount and/or Complexity of Data Reviewed Independent Historian: spouse    Details: hx External Data Reviewed: notes. Labs: ordered. Decision-making details documented in ED Course. Radiology: ordered and independent interpretation performed. Decision-making details documented in ED Course.  Risk Prescription drug management. Decision regarding hospitalization.   Imaging ordered.   Differential diagnosis includes head injury/sdh, contusion, extremity fx vs contusion, etc . Dispo decision including potential need for admission considered - will get imaging and reassess.   Reviewed nursing notes and prior charts for additional history. External reports reviewed. Additional history from: family/spouse.   Xrays reviewed/interpreted by me - no fx.   CT reviewed/interpreted by me - no hem.  Percocet po. Wounds/abrasions cleaned, sterile dressing.   Pt currently appears  stable for d/c.   Return precautions provided.            Final Clinical Impression(s) / ED Diagnoses Final diagnoses:  Fall from slip, trip, or stumble, initial encounter  Contusion of left knee, initial encounter  Abrasion, left knee, initial encounter  Contusion of left hand, initial encounter  Abrasion of finger of left hand, initial encounter  Contusion of head, initial encounter    Rx / DC Orders ED Discharge Orders          Ordered    methocarbamol (ROBAXIN) 750 MG tablet  3 times daily PRN        04/13/22 2231              Lajean Saver, MD 04/13/22 2240

## 2022-04-13 NOTE — ED Provider Triage Note (Signed)
Emergency Medicine Provider Triage Evaluation Note  [place] , a 52 y.o. male  was evaluated in triage.  Pt complains of left hand pain and left knee pain and mild right facial pain after fall.  He tripped in a dark parking lot and fell forward.  He states he did not lose consciousness.  He is not on blood thinners.  He is having the most pain in the left knee and left hand which broke his fall.  He has some mild bleeding to the left hand.  Last tetanus shot was 2 years ago.  He denies nausea or vomiting or other symptoms.  Review of Systems  Positive: Hand pain, left knee pain Negative: Loc, dizziness  Physical Exam  BP (!) 135/103 (BP Location: Right Arm)   Pulse 75   Temp 98 F (36.7 C) (Oral)   Resp 20   Ht '5\' 5"'$  (1.651 m)   Wt 80.7 kg   SpO2 100%   BMI 29.62 kg/m  Gen:   Awake, no distress   Resp:  Normal effort  MSK:   Moves extremities without difficulty pain to left anterior knee with mild overlying abrasion.  Patient can bear partial weight only.  DP and PT pulses 2+ in the left foot.  There is tenderness to left hand diffusely with radial pulse 2+.  No deformity of the hand is noted Other:    Medical Decision Making  Medically screening exam initiated at 8:13 PM.  Appropriate orders placed.  [facility] was informed that the remainder of the evaluation will be completed by another provider, this initial triage assessment does not replace that evaluation, and the importance of remaining in the ED until their evaluation is complete.      Gwenevere Abbot, [place] 04/13/22 2016

## 2022-04-13 NOTE — ED Provider Notes (Signed)
Patient seen briefly in triage.  He had [place] and hurt his head and his left leg.  Also his left hand is bleeding.  No loss of consciousness noted  He does have some obvious swelling on the right temporal area.  I have asked him to proceed to the emergency room for further evaluation and treatment, as he may need higher level of evaluation and treatment then we can provide here in the urgent care setting   Barrett Henle, MD 04/13/22 1958

## 2022-04-13 NOTE — ED Notes (Signed)
Patient wounds cleaned and bandaged.

## 2022-04-13 NOTE — Discharge Instructions (Addendum)
It was our pleasure to provide your ER care today - we hope that you feel better.  Keep abrasions very clean.   Take acetaminophen or ibuprofen as need for pain.  You may also take robaxin as need for muscle pain/spasm - no driving for the next 6 hours or if/when taking robaxin.   Follow up with primary care doctor in 1-2 weeks if symptoms fail to improve/resolve. Your blood pressure is high tonight - follow up with primary care doctor in the next 1-2 weeks.   Return to ER if worse, new symptoms, new/severe pain, infection of wounds, or other concern.

## 2022-04-16 ENCOUNTER — Encounter: Payer: Self-pay | Admitting: Internal Medicine

## 2022-04-16 NOTE — Patient Instructions (Addendum)
Blood work was ordered.   The lab is on the first floor.    Medications changes include :   Tamiflu once daily x 7 days      Return in about 1 year (around 04/18/2023) for Physical Exam.   Health Maintenance, Male Adopting a healthy lifestyle and getting preventive care are important in promoting health and wellness. Ask your health care provider about: The right schedule for you to have regular tests and exams. Things you can do on your own to prevent diseases and keep yourself healthy. What should I know about diet, weight, and exercise? Eat a healthy diet  Eat a diet that includes plenty of vegetables, fruits, low-fat dairy products, and lean protein. Do not eat a lot of foods that are high in solid fats, added sugars, or sodium. Maintain a healthy weight Body mass index (BMI) is a measurement that can be used to identify possible weight problems. It estimates body fat based on height and weight. Your health care provider can help determine your BMI and help you achieve or maintain a healthy weight. Get regular exercise Get regular exercise. This is one of the most important things you can do for your health. Most adults should: Exercise for at least 150 minutes each week. The exercise should increase your heart rate and make you sweat (moderate-intensity exercise). Do strengthening exercises at least twice a week. This is in addition to the moderate-intensity exercise. Spend less time sitting. Even light physical activity can be beneficial. Watch cholesterol and blood lipids Have your blood tested for lipids and cholesterol at 50 years of age, then have this test every 5 years. You may need to have your cholesterol levels checked more often if: Your lipid or cholesterol levels are high. You are older than 50 years of age. You are at high risk for heart disease. What should I know about cancer screening? Many types of cancers can be detected early and may often be  prevented. Depending on your health history and family history, you may need to have cancer screening at various ages. This may include screening for: Colorectal cancer. Prostate cancer. Skin cancer. Lung cancer. What should I know about heart disease, diabetes, and high blood pressure? Blood pressure and heart disease High blood pressure causes heart disease and increases the risk of stroke. This is more likely to develop in people who have high blood pressure readings or are overweight. Talk with your health care provider about your target blood pressure readings. Have your blood pressure checked: Every 3-5 years if you are 70-58 years of age. Every year if you are 49 years old or older. If you are between the ages of 53 and 9 and are a current or former smoker, ask your health care provider if you should have a one-time screening for abdominal aortic aneurysm (AAA). Diabetes Have regular diabetes screenings. This checks your fasting blood sugar level. Have the screening done: Once every three years after age 4 if you are at a normal weight and have a low risk for diabetes. More often and at a younger age if you are overweight or have a high risk for diabetes. What should I know about preventing infection? Hepatitis B If you have a higher risk for hepatitis B, you should be screened for this virus. Talk with your health care provider to find out if you are at risk for hepatitis B infection. Hepatitis C Blood testing is recommended for: Everyone born from [birth year]  through 1963. Anyone with known risk factors for hepatitis C. Sexually transmitted infections (STIs) You should be screened each year for STIs, including gonorrhea and chlamydia, if: You are sexually active and are younger than 50 years of age. You are older than 50 years of age and your health care provider tells you that you are at risk for this type of infection. Your sexual activity has changed since you were last screened,  and you are at increased risk for chlamydia or gonorrhea. Ask your health care provider if you are at risk. Ask your health care provider about whether you are at high risk for HIV. Your health care provider may recommend a prescription medicine to help prevent HIV infection. If you choose to take medicine to prevent HIV, you should first get tested for HIV. You should then be tested every 3 months for as long as you are taking the medicine. Follow these instructions at home: Alcohol use Do not drink alcohol if your health care provider tells you not to drink. If you drink alcohol: Limit how much you have to 0-2 drinks a day. Know how much alcohol is in your drink. In the [place] one drink equals one 12 oz bottle of beer (355 mL), one 5 oz glass of wine (148 mL), or one 1 oz glass of hard liquor (44 mL). Lifestyle Do not use any products that contain nicotine or tobacco. These products include cigarettes, chewing tobacco, and vaping devices, such as e-cigarettes. If you need help quitting, ask your health care provider. Do not use street drugs. Do not share needles. Ask your health care provider for help if you need support or information about quitting drugs. General instructions Schedule regular health, dental, and eye exams. Stay current with your vaccines. Tell your health care provider if: You often feel depressed. You have ever been abused or do not feel safe at home. Summary Adopting a healthy lifestyle and getting preventive care are important in promoting health and wellness. Follow your health care provider's instructions about healthy diet, exercising, and getting tested or screened for diseases. Follow your health care provider's instructions on monitoring your cholesterol and blood pressure. This information is not intended to replace advice given to you by your health care provider. Make sure you discuss any questions you have with your health care provider. Document Revised:  07/18/2020 Document Reviewed: 07/18/2020 Elsevier Patient Education  [employer].

## 2022-04-16 NOTE — Progress Notes (Unsigned)
Subjective:    Patient ID: Danny Zimmerman, male    DOB: 1971/01/06, 52 y.o.   MRN: [id number]     HPI Danny Zimmerman is here for a physical exam.   04/13/22 - had fall in parking lot - went to ED.  No fx or brain bleed.  He was diagnosed with a concussion.  Still has a headache and some lightheadedness/fogginess..  Still sore from scraped knees.  Left shoudler hurting more since fall.  He has pain on the right side of his head from where he hit it, neck pain.  He was taking the muscle relaxer, but stopped it because he is having surgery in 2 days.  He is taking Tylenol.   Left rotator cuff tear  - surgery this Thursday.    Medications and allergies reviewed with patient and updated if appropriate.  Current Outpatient Medications on File Prior to Visit  Medication Sig Dispense Refill   ibuprofen (ADVIL) 200 MG tablet Take 200 mg by mouth every 6 (six) hours as needed.     methocarbamol (ROBAXIN) 750 MG tablet Take 1 tablet (750 mg total) by mouth 3 (three) times daily as needed (muscle spasm/pain). 15 tablet 0   No current facility-administered medications on file prior to visit.    Review of Systems  Constitutional:  Negative for fever.  Eyes:  Positive for visual disturbance (with concussion).  Respiratory:  Negative for cough, shortness of breath and wheezing.   Cardiovascular:  Negative for chest pain, palpitations and leg swelling.  Gastrointestinal:  Negative for abdominal pain, blood in stool, constipation and diarrhea.       No gerd  Genitourinary:  Negative for difficulty urinating, dysuria and hematuria.  Musculoskeletal:  Positive for arthralgias (left shoudler), myalgias (from fall) and neck pain (from fall). Negative for back pain.  Skin:  Negative for rash.  Neurological:  Positive for light-headedness and headaches (with concussion).  Psychiatric/Behavioral:  Negative for dysphoric mood. The patient is not nervous/anxious.        Objective:   Vitals:   04/17/22 1315   BP: 122/80  Pulse: 73  Temp: 98.5 F (36.9 C)  SpO2: 97%   Filed Weights   04/17/22 1315  Weight: 187 lb (84.8 kg)   Body mass index is 31.12 kg/m.  BP Readings from Last 3 Encounters:  04/17/22 122/80  04/13/22 (!) 135/103  07/13/21 119/83    Wt Readings from Last 3 Encounters:  04/17/22 187 lb (84.8 kg)  04/13/22 178 lb (80.7 kg)  07/13/21 175 lb (79.4 kg)      Physical Exam Constitutional: He appears well-developed and well-nourished. No distress.  HENT:  Head: Normocephalic and atraumatic.  Right Ear: External ear normal.  Left Ear: External ear normal.  Mouth/Throat: Oropharynx is clear and moist.  Normal ear canals and TM b/l  Eyes: Conjunctivae and EOM are normal.  Neck: Neck supple. No tracheal deviation present. No thyromegaly present.  No carotid bruit  Cardiovascular: Normal rate, regular rhythm, normal heart sounds and intact distal pulses.   No murmur heard. Pulmonary/Chest: Effort normal and breath sounds normal. No respiratory distress. He has no wheezes. He has no rales.  Abdominal: Soft. He exhibits no distension. There is no tenderness.  Genitourinary: deferred  Musculoskeletal: He exhibits no edema.  Lymphadenopathy:   He has no cervical adenopathy.  Skin: Skin is warm and dry. He is not diaphoretic.  Psychiatric: He has a normal mood and affect. His behavior is normal.  Assessment & Plan:   Physical exam: Screening blood work  ordered Exercise  usually regular - on hold for now    Weight  ok for body build Substance abuse   none   Reviewed recommended immunizations.   Health Maintenance  Topic Date Due   DTaP/Tdap/Td (2 - Td or Tdap) 01/31/2028   COLONOSCOPY (Pts 45-46yr Insurance coverage will need to be confirmed)  07/13/2028   Hepatitis C Screening  Completed   HIV Screening  Completed   HPV VACCINES  Aged Out   INFLUENZA VACCINE  Discontinued   COVID-19 Vaccine  Discontinued     See Problem List for  Assessment and Plan of chronic medical problems.

## 2022-04-17 ENCOUNTER — Ambulatory Visit (INDEPENDENT_AMBULATORY_CARE_PROVIDER_SITE_OTHER): Payer: Managed Care, Other (non HMO) | Admitting: Internal Medicine

## 2022-04-17 ENCOUNTER — Encounter: Payer: Self-pay | Admitting: Internal Medicine

## 2022-04-17 VITALS — BP 122/80 | HR 73 | Temp 98.5°F | Ht 65.0 in | Wt 187.0 lb

## 2022-04-17 DIAGNOSIS — Z0000 Encounter for general adult medical examination without abnormal findings: Secondary | ICD-10-CM | POA: Diagnosis not present

## 2022-04-17 DIAGNOSIS — S060XAA Concussion with loss of consciousness status unknown, initial encounter: Secondary | ICD-10-CM | POA: Insufficient documentation

## 2022-04-17 DIAGNOSIS — R7303 Prediabetes: Secondary | ICD-10-CM | POA: Diagnosis not present

## 2022-04-17 DIAGNOSIS — S060X0A Concussion without loss of consciousness, initial encounter: Secondary | ICD-10-CM

## 2022-04-17 DIAGNOSIS — Z125 Encounter for screening for malignant neoplasm of prostate: Secondary | ICD-10-CM | POA: Diagnosis not present

## 2022-04-17 DIAGNOSIS — R7989 Other specified abnormal findings of blood chemistry: Secondary | ICD-10-CM

## 2022-04-17 DIAGNOSIS — Z136 Encounter for screening for cardiovascular disorders: Secondary | ICD-10-CM

## 2022-04-17 DIAGNOSIS — Z20828 Contact with and (suspected) exposure to other viral communicable diseases: Secondary | ICD-10-CM | POA: Insufficient documentation

## 2022-04-17 LAB — CBC WITH DIFFERENTIAL/PLATELET
Basophils Absolute: 0 10*3/uL (ref 0.0–0.1)
Basophils Relative: 0.7 % (ref 0.0–3.0)
Eosinophils Absolute: 0.1 10*3/uL (ref 0.0–0.7)
Eosinophils Relative: 1 % (ref 0.0–5.0)
HCT: 46.1 % (ref 39.0–52.0)
Hemoglobin: 15.9 g/dL (ref 13.0–17.0)
Lymphocytes Relative: 39 % (ref 12.0–46.0)
Lymphs Abs: 2.5 10*3/uL (ref 0.7–4.0)
MCHC: 34.4 g/dL (ref 30.0–36.0)
MCV: 84.1 fl (ref 78.0–100.0)
Monocytes Absolute: 0.4 10*3/uL (ref 0.1–1.0)
Monocytes Relative: 6.4 % (ref 3.0–12.0)
Neutro Abs: 3.4 10*3/uL (ref 1.4–7.7)
Neutrophils Relative %: 52.9 % (ref 43.0–77.0)
Platelets: 335 10*3/uL (ref 150.0–400.0)
RBC: 5.49 Mil/uL (ref 4.22–5.81)
RDW: 13.2 % (ref 11.5–15.5)
WBC: 6.5 10*3/uL (ref 4.0–10.5)

## 2022-04-17 LAB — COMPREHENSIVE METABOLIC PANEL
ALT: 34 U/L (ref 0–53)
AST: 25 U/L (ref 0–37)
Albumin: 5 g/dL (ref 3.5–5.2)
Alkaline Phosphatase: 81 U/L (ref 39–117)
BUN: 8 mg/dL (ref 6–23)
CO2: 23 mEq/L (ref 19–32)
Calcium: 9.9 mg/dL (ref 8.4–10.5)
Chloride: 100 mEq/L (ref 96–112)
Creatinine, Ser: 0.76 mg/dL (ref 0.40–1.50)
GFR: 105.79 mL/min (ref >60.00)
Glucose, Bld: 86 mg/dL (ref 70–99)
Potassium: 3.9 mEq/L (ref 3.5–5.1)
Sodium: 137 mEq/L (ref 135–145)
Total Bilirubin: 0.9 mg/dL (ref 0.2–1.2)
Total Protein: 8.1 g/dL (ref 6.0–8.3)

## 2022-04-17 LAB — HEMOGLOBIN A1C: Hgb A1c MFr Bld: 5.9 % (ref 4.6–6.5)

## 2022-04-17 LAB — PSA: PSA: 0.8 ng/mL (ref 0.10–4.00)

## 2022-04-17 LAB — LIPID PANEL
Cholesterol: 220 mg/dL — ABNORMAL HIGH (ref 0–200)
HDL: 46.8 mg/dL (ref >39.00)
Total CHOL/HDL Ratio: 5
Triglycerides: 479 mg/dL — ABNORMAL HIGH (ref 0.0–149.0)

## 2022-04-17 LAB — TSH: TSH: 1.07 u[IU]/mL (ref 0.35–5.50)

## 2022-04-17 LAB — LDL CHOLESTEROL, DIRECT: Direct LDL: 86 mg/dL

## 2022-04-17 MED ORDER — OSELTAMIVIR PHOSPHATE 75 MG PO CAPS: 75.0000 mg | ORAL_CAPSULE | Freq: Every day | ORAL | 0 refills | Status: DC

## 2022-04-17 NOTE — Assessment & Plan Note (Signed)
Chronic Check a1c Low sugar / carb diet Stressed regular exercise  

## 2022-04-17 NOTE — Assessment & Plan Note (Signed)
Family has flu Will start him on prophylactic Tamiflu

## 2022-04-17 NOTE — Assessment & Plan Note (Signed)
Recent fall, went to ED and was diagnosed with concussion No brain bleed Still having headaches, lightheadedness and body feeling Discussed that the symptoms should slowly improve and should not worsen at this point and if they do he should let me know Advised that with increased activity his symptoms make it worse and that he needs to back off of his activity Increase rest Symptomatic treatment

## 2022-06-20 DIAGNOSIS — M25612 Stiffness of left shoulder, not elsewhere classified: Secondary | ICD-10-CM | POA: Diagnosis not present

## 2022-06-20 DIAGNOSIS — Z9889 Other specified postprocedural states: Secondary | ICD-10-CM | POA: Diagnosis not present

## 2022-06-20 DIAGNOSIS — R531 Weakness: Secondary | ICD-10-CM | POA: Diagnosis not present

## 2022-06-20 DIAGNOSIS — M25512 Pain in left shoulder: Secondary | ICD-10-CM | POA: Diagnosis not present

## 2022-06-26 ENCOUNTER — Ambulatory Visit (INDEPENDENT_AMBULATORY_CARE_PROVIDER_SITE_OTHER): Payer: Managed Care, Other (non HMO) | Admitting: Internal Medicine

## 2022-06-26 ENCOUNTER — Encounter: Payer: Self-pay | Admitting: Internal Medicine

## 2022-06-26 VITALS — BP 114/80 | HR 90 | Temp 98.6°F | Ht 65.0 in | Wt 195.0 lb

## 2022-06-26 DIAGNOSIS — R140 Abdominal distension (gaseous): Secondary | ICD-10-CM | POA: Insufficient documentation

## 2022-06-26 NOTE — Assessment & Plan Note (Signed)
New Started the past few weeks He has been home since having his shoulder surgery so there has been a big change in lifestyle He is eating differently-likely eating more carbs, has gained weight and is less active Unsure if the symptoms are related to any specific foods or not No pain, blood in the stool, reflux or nausea-just uncomfortability Does have more gas Reviewed several foods that could cause increased gas, bloating-discussed changing diet Also encouraged regular exercise and weight loss which he knows he needs to do No further evaluation at this time given no pain or other concerning symptoms He will let me know if there is no improvement

## 2022-06-26 NOTE — Progress Notes (Signed)
    Subjective:    Patient ID: Danny Zimmerman, male    DOB: Feb 23, 1971, 52 y.o.   MRN: [id number]      HPI Danny Zimmerman is here for  Chief Complaint  Patient presents with   Bloated     Abdominal bloating after eating - this started the past few weeks.  He had left shoulder surgery and has been home from work.  He has gained some weight since being at home.  His eating has changed and his overall activity level is changed.  The bloating is not related to specific foods is as he can tell.  He thinks he is eating more carbs.  He denies constipation-bowel movements are normal.  Denies any reflux or nausea.  He did his abdominal pain.  He did have 1 episode bleeding which he thinks hemorrhoidal.  Colonoscopy up-to-date-done 07/2021.    Medications and allergies reviewed with patient and updated if appropriate.  Current Outpatient Medications on File Prior to Visit  Medication Sig Dispense Refill   Acetaminophen (TYLENOL 8 HOUR PO) Take by mouth as needed.     ibuprofen (ADVIL) 200 MG tablet Take 200 mg by mouth every 6 (six) hours as needed.     No current facility-administered medications on file prior to visit.    Review of Systems  Gastrointestinal:  Positive for abdominal distention (bloating) and anal bleeding (the other day - one episode). Negative for abdominal pain, blood in stool, constipation, diarrhea and nausea.       Daily stools - soft.  No gerd       Objective:   Vitals:   06/26/22 1445  BP: 114/80  Pulse: 90  Temp: 98.6 F (37 C)  SpO2: 94%   BP Readings from Last 3 Encounters:  06/26/22 114/80  04/17/22 122/80  04/13/22 (!) 135/103   Wt Readings from Last 3 Encounters:  06/26/22 195 lb (88.5 kg)  04/17/22 187 lb (84.8 kg)  04/13/22 178 lb (80.7 kg)   Body mass index is 32.45 kg/m.    Physical Exam Constitutional:      General: He is not in acute distress.    Appearance: Normal appearance. He is not ill-appearing.  HENT:     Head: Normocephalic and  atraumatic.  Abdominal:     General: There is no distension.     Palpations: Abdomen is soft.     Tenderness: There is no abdominal tenderness. There is no guarding or rebound.  Skin:    General: Skin is warm and dry.  Neurological:     Mental Status: He is alert.            Assessment & Plan:    See Problem List for Assessment and Plan of chronic medical problems.

## 2022-06-26 NOTE — Patient Instructions (Addendum)
        Medications changes include :   none   Work on [employer] and see if that helps.    Return if symptoms worsen or fail to improve.      Abdominal Bloating When you have abdominal bloating, your abdomen may feel full, tight, or painful. It may also look bigger than normal or swollen (distended). Common causes of abdominal bloating include: Swallowing air. Constipation. Problems digesting food. Eating too much. Irritable bowel syndrome. This is a condition that affects the large intestine. Lactose intolerance. This is an inability to digest lactose, a natural sugar in dairy products. Celiac disease. This is a condition that affects the ability to digest gluten, a protein found in some grains. Gastroparesis. This is a condition that slows down the movement of food in the stomach and small intestine. It is more common in people with diabetes mellitus. Gastroesophageal reflux disease (GERD). This is a condition that makes stomach acid flow back into the esophagus. Urinary retention. This means that the body is holding onto urine, and the bladder cannot be emptied all the way. Follow these instructions at home: Eating and drinking Avoid eating too much. Try not to swallow air while talking or eating. Avoid eating while lying down. Avoid these foods and drinks: Foods that cause gas, such as broccoli, cabbage, cauliflower, and baked beans. Carbonated drinks. Hard candy. Chewing gum. Medicines Take over-the-counter and prescription medicines only as told by your health care provider. Take probiotic medicines. These medicines contain live bacteria or yeasts that can help digestion. Take coated peppermint oil capsules. General instructions Try to exercise regularly. Exercise may help to relieve bloating that is caused by gas and relieve constipation. Keep all follow-up visits. This is important. Contact a health care provider if: You have nausea and vomiting. You  have diarrhea. You have abdominal pain. You have unusual weight loss or weight gain. You have severe pain, and medicines do not help. Get help right away if: You have chest pain. You have trouble breathing. You have shortness of breath. You have trouble urinating. You have darker urine than normal. You have blood in your stools or have dark, tarry stools. These symptoms may represent a serious problem that is an emergency. Do not wait to see if the symptoms will go away. Get medical help right away. Call your local emergency services (911 in the [place] Do not drive yourself to the hospital. Summary Abdominal bloating means that the abdomen is swollen. Common causes of abdominal bloating are swallowing air, constipation, and problems digesting food. Avoid eating too much and avoid swallowing air. Avoid foods that cause gas, carbonated drinks, hard candy, and chewing gum. This information is not intended to replace advice given to you by your health care provider. Make sure you discuss any questions you have with your health care provider. Document Revised: 09/29/2019 Document Reviewed: 09/29/2019 Elsevier Patient Education  2023 [employer].

## 2022-06-27 DIAGNOSIS — M25512 Pain in left shoulder: Secondary | ICD-10-CM | POA: Diagnosis not present

## 2022-06-27 DIAGNOSIS — Z9889 Other specified postprocedural states: Secondary | ICD-10-CM | POA: Diagnosis not present

## 2022-06-27 DIAGNOSIS — M25612 Stiffness of left shoulder, not elsewhere classified: Secondary | ICD-10-CM | POA: Diagnosis not present

## 2022-06-27 DIAGNOSIS — R531 Weakness: Secondary | ICD-10-CM | POA: Diagnosis not present

## 2022-07-03 DIAGNOSIS — M25612 Stiffness of left shoulder, not elsewhere classified: Secondary | ICD-10-CM | POA: Diagnosis not present

## 2022-07-03 DIAGNOSIS — M25512 Pain in left shoulder: Secondary | ICD-10-CM | POA: Diagnosis not present

## 2022-07-03 DIAGNOSIS — R531 Weakness: Secondary | ICD-10-CM | POA: Diagnosis not present

## 2022-07-09 ENCOUNTER — Emergency Department (HOSPITAL_COMMUNITY)
Admission: EM | Admit: 2022-07-09 | Discharge: 2022-07-09 | Disposition: A | Discharge status: Home / Self Care | Payer: BC Managed Care – PPO | Attending: Emergency Medicine | Admitting: Emergency Medicine

## 2022-07-09 ENCOUNTER — Ambulatory Visit
Admission: EM | Admit: 2022-07-09 | Discharge: 2022-07-09 | Disposition: A | Discharge status: Home / Self Care | Payer: BC Managed Care – PPO

## 2022-07-09 ENCOUNTER — Other Ambulatory Visit: Payer: Self-pay

## 2022-07-09 DIAGNOSIS — G510 Bell's palsy: Secondary | ICD-10-CM | POA: Diagnosis not present

## 2022-07-09 DIAGNOSIS — R1310 Dysphagia, unspecified: Secondary | ICD-10-CM

## 2022-07-09 DIAGNOSIS — R29810 Facial weakness: Secondary | ICD-10-CM

## 2022-07-09 DIAGNOSIS — R519 Headache, unspecified: Secondary | ICD-10-CM

## 2022-07-09 DIAGNOSIS — I10 Essential (primary) hypertension: Secondary | ICD-10-CM | POA: Diagnosis not present

## 2022-07-09 MED ORDER — VALACYCLOVIR HCL 1 G PO TABS: 1000.0000 mg | ORAL_TABLET | Freq: Three times a day (TID) | ORAL | 0 refills | Status: DC

## 2022-07-09 MED ORDER — PREDNISONE 20 MG PO TABS: 60.0000 mg | ORAL_TABLET | Freq: Every day | ORAL | 0 refills | Status: AC

## 2022-07-09 NOTE — ED Provider Notes (Signed)
[place] EMERGENCY DEPARTMENT AT [facility] Provider Note   CSN: [id number] Arrival date & time: 07/09/22  1853     History  Chief Complaint  Patient presents with   Facial Droop    Danny Zimmerman is a 52 y.o. male.  HPI   This patient is a very pleasant 52 year old male, he denies any chronic medical problems, he states that he was normal when he woke up this morning, he reports that throughout the day he noticed that he developed a feeling of facial drooping on the left side of his face like something was not quite right.  His significant other saw him and confirmed that this was not right and felt like his speech may have been abnormal.  He notes that he cannot close his left eyelid, he cannot whistle, he is also noticed that food has tasted differently today as well.  He denies any numbness or weakness or changes in vision, he has no difficulty walking or using his hands for coordination or strength.  He denies any difficulty with speech and his spouse at the bedside reports that his speech is normal at this time.  He was seen at the urgent care where it was thought that he may have something more concerning given the headache and the neurologic symptoms.  The patient reports that he does have a headache for the last few days but it is gradually improving and it is minimal at this time.  Home Medications Prior to Admission medications   Medication Sig Start Date End Date Taking? Authorizing Provider  predniSONE (DELTASONE) 20 MG tablet Take 3 tablets (60 mg total) by mouth daily for 7 days. 07/09/22 07/16/22 Yes Eber Hong, MD  valACYclovir (VALTREX) 1000 MG tablet Take 1 tablet (1,000 mg total) by mouth 3 (three) times daily. 07/09/22  Yes Eber Hong, MD  Acetaminophen (TYLENOL 8 HOUR PO) Take by mouth as needed.    [provider]  ibuprofen (ADVIL) 200 MG tablet Take 200 mg by mouth every 6 (six) hours as needed.    [provider]      Allergies     Patient has no known allergies.    Review of Systems   Review of Systems  All other systems reviewed and are negative.   Physical Exam Updated Vital Signs BP (!) 136/95 (BP Location: Right Arm)   Pulse 71   Temp 97.7 F (36.5 C) (Oral)   Resp 19   Ht 1.651 m (5\' 5" )   Wt 86.2 kg   SpO2 98%   BMI 31.62 kg/m  Physical Exam Vitals and nursing note reviewed.  Constitutional:      General: He is not in acute distress.    Appearance: He is well-developed.  HENT:     Head: Normocephalic and atraumatic.     Ears:     Comments: Ears are normal with regards to tympanic membranes, he has no ringing in the ears or tenderness, he has no signs of external auditory canal swelling or inflammation    Mouth/Throat:     Pharynx: No oropharyngeal exudate.  Eyes:     General: No scleral icterus.       Right eye: No discharge.        Left eye: No discharge.     Conjunctiva/sclera: Conjunctivae normal.     Pupils: Pupils are equal, round, and reactive to light.  Neck:     Thyroid: No thyromegaly.     Vascular: No JVD.  Comments: Very supple neck with no lymphadenopathy. Cardiovascular:     Rate and Rhythm: Normal rate and regular rhythm.     Heart sounds: Normal heart sounds. No murmur heard.    No friction rub. No gallop.  Pulmonary:     Effort: Pulmonary effort is normal. No respiratory distress.     Breath sounds: Normal breath sounds. No wheezing or rales.  Abdominal:     General: Bowel sounds are normal. There is no distension.     Palpations: Abdomen is soft. There is no mass.     Tenderness: There is no abdominal tenderness.  Musculoskeletal:        General: No tenderness. Normal range of motion.     Cervical back: Normal range of motion and neck supple.  Lymphadenopathy:     Cervical: No cervical adenopathy.  Skin:    General: Skin is warm and dry.     Findings: No erythema or rash.  Neurological:     Mental Status: He is alert.     Coordination: Coordination  normal.     Comments: The only neurologic abnormalities that the patient has is that he has a left-sided facial droop, he cannot close his left eye, the left forehead does not raise the same amount as the right forehead.  Food has tasted different, there is no sensory deficits to the face the arms of the legs.  He has normal finger-nose-finger, totally normal strength in all 4 extremities, normal speech and memory and level of alertness.  Gait is totally normal.  Psychiatric:        Behavior: Behavior normal.     ED Results / Procedures / Treatments   Labs (all labs ordered are listed, but only abnormal results are displayed) Labs Reviewed - No data to display  EKG None  Radiology No results found.  Procedures Procedures    Medications Ordered in ED Medications - No data to display  ED Course/ Medical Decision Making/ A&P                             Medical Decision Making Risk Prescription drug management.   The patient was noted to have hypertension, and discharge 136/95, he is not tachycardic and has no neurologic symptoms other than what appears to be a Bell palsy.  I do not think that further evaluation with neuroimaging or labs is necessary given this acute onset of a classic syndrome.  On my exam he does in fact have some decreased ability to lift the left forehead, the rest of the exams are classic, he has no other neurologic findings to suggest that this is a stroke or more concerning finding.  He will follow-up with neurology family doctor and will be prescribed with prednisone and Valtrex.  He is agreeable and is aware that his blood pressure has been slightly elevated.  He will also have this rechecked at his family doctor's office.        Final Clinical Impression(s) / ED Diagnoses Final diagnoses:  Bell's palsy    Rx / DC Orders ED Discharge Orders          Ordered    valACYclovir (VALTREX) 1000 MG tablet  3 times daily        07/09/22 1913     predniSONE (DELTASONE) 20 MG tablet  Daily        07/09/22 1913  Eber Hong, MD 07/09/22 [phone]

## 2022-07-09 NOTE — ED Triage Notes (Signed)
Pt reports headache x 3 days; per wife pt has slurred speech, left side of the looks drooping started today around 1700.

## 2022-07-09 NOTE — Discharge Instructions (Signed)
Prednisone 60 mg a day for 7 days, Valtrex 1 tablet every 8 hours or 3 times a day for the next 7 days  Please read the attached instructions  You will want to put a piece of tape on your eyelid to keep it closed throughout the evening.  Please keep artificial tears in your pocket and use them on your left eye throughout the day to keep it nice and lubricated and moist.  You should see an eye doctor for an eye exam or your family doctor for an eye exam within 1 week and I want you to follow-up with the neurologist within the month.  You are to return to the emergency department immediately if you develop increasing or worsening difficulty with speech coordination walking or any weakness or numbness of the arms of the legs as this would indicate that your symptoms are much more concerning.

## 2022-07-09 NOTE — Discharge Instructions (Signed)
Pt to ER via EMS for rule out CVA

## 2022-07-09 NOTE — ED Notes (Signed)
Patient is being discharged from the Urgent Care and sent to the Emergency Department via [facility]. Per Cheri Rous, NP, patient is in need of higher level of care due to left facial droop and left sided headache. Patient is aware and verbalizes understanding of plan of care.  Vitals:   07/09/22 1801  BP: (!) 131/94  Pulse: 78  Resp: 18  Temp: 99 F (37.2 C)  SpO2: 99%

## 2022-07-09 NOTE — ED Notes (Signed)
EMS called. Ruled out stroke.

## 2022-07-09 NOTE — ED Triage Notes (Signed)
Pt BIB [facility] from urgent care due to facial droop on left side that started around 1300 today.  No extremity weakness.  Pt does endorses headache all over.  20g right AC.  VS BP 134/99, HR 70, SpO2 97%, CBG 92

## 2022-07-09 NOTE — ED Provider Notes (Addendum)
[facility]    CSN: [id number] Arrival date & time: 07/09/22  1755      History   Chief Complaint Chief Complaint  Patient presents with   Headache    HPI Danny Zimmerman is a 52 y.o. male presents for evaluation of facial drooping.  Patient is present with his wife.  Patient reports he had a left-sided posterior headache for the past few days that remained constant in nature he describes as a pinching stabbing type pain.  He reports the headache is currently a 6 out of 10 and denies it is the worst headache of his life.  It does not improve with ibuprofen.  He is also been reporting some left-sided neck pain over the past few days but reports that he got a new pillow and thought it was contributed to that.  He also had left-sided rotator cuff surgery recently and thought that might be contributing to his neck pain as well.  When his wife got home from work 1 hour ago noticed he had some drooping on the left side of his face and he was slurring his words.  This prompted her to bring him in for evaluation.  He denies any unilateral weakness or vision changes.  He does state when he was eating earlier today he was having difficulty swallowing on the left side as well.  Denies any nuchal rigidity or photosensitivity.  No history of stroke.  No chest pain or shortness of breath.  No other concerns at this time.   Headache   Past Medical History:  Diagnosis Date   Allergy    pollen   GERD (gastroesophageal reflux disease)    History of kidney stones    Ureteral stone     Patient Active Problem List   Diagnosis Date Noted   Abdominal bloating 06/26/2022   Exposure to the flu 04/17/2022   Concussion 04/17/2022   Right knee pain 04/12/2021   History of nephrolithiasis 02/11/2019   Prediabetes 02/10/2019    Past Surgical History:  Procedure Laterality Date   CYSTOSCOPY/RETROGRADE/URETEROSCOPY/STONE EXTRACTION WITH BASKET Left 02/01/2015   Procedure: CYSTOSCOPY/LEFT  RETROGRADE/LEFT URETERAL BALLON DILITATION/LEFT /URETEROSCOPY/STONE EXTRACTION WITH BASKET LEFT URETERAL STENT;  Surgeon: Jethro Bolus, MD;  Location: [facility];  Service: Urology;  Laterality: Left;   UPPER GASTROINTESTINAL ENDOSCOPY  2018       Home Medications    Prior to Admission medications   Medication Sig Start Date End Date Taking? Authorizing Provider  Acetaminophen (TYLENOL 8 HOUR PO) Take by mouth as needed.    [provider]  ibuprofen (ADVIL) 200 MG tablet Take 200 mg by mouth every 6 (six) hours as needed.    [provider]    Family History Family History  Problem Relation Age of Onset   Hyperlipidemia Mother    Colon cancer Maternal Aunt    Diabetes Paternal Uncle    Esophageal cancer Neg Hx    Stomach cancer Neg Hx    Colon polyps Neg Hx    Rectal cancer Neg Hx     Social History Social History   Tobacco Use   Smoking status: Never   Smokeless tobacco: Never  Vaping Use   Vaping Use: Never used  Substance Use Topics   Alcohol use: Yes    Comment: socially   Drug use: No     Allergies   Patient has no known allergies.   Review of Systems Review of Systems  Neurological:  Positive for headaches.  Facial drooping     Physical Exam Triage Vital Signs ED Triage Vitals [07/09/22 1801]  Enc Vitals Group     BP (!) 131/94     Pulse Rate 78     Resp 18     Temp 99 F (37.2 C)     Temp Source Oral     SpO2 99 %     Weight      Height      Head Circumference      Peak Flow      Pain Score      Pain Loc      Pain Edu?      Excl. in GC?    No data found.  Updated Vital Signs BP (!) 131/94 (BP Location: Right Arm)   Pulse 78   Temp 99 F (37.2 C) (Oral)   Resp 18   SpO2 99%   Visual Acuity Right Eye Distance:   Left Eye Distance:   Bilateral Distance:    Right Eye Near:   Left Eye Near:    Bilateral Near:     Physical Exam Vitals and nursing note reviewed.  Constitutional:      General: He  is not in acute distress.    Appearance: Normal appearance.  HENT:     Head: Normocephalic and atraumatic.     Mouth/Throat:     Mouth: Mucous membranes are moist.  Eyes:     Extraocular Movements: Extraocular movements intact.     Conjunctiva/sclera: Conjunctivae normal.     Pupils: Pupils are equal, round, and reactive to light.  Cardiovascular:     Rate and Rhythm: Normal rate.  Pulmonary:     Effort: Pulmonary effort is normal.  Musculoskeletal:     Cervical back: Normal range of motion and neck supple. Tenderness present. No rigidity.  Skin:    General: Skin is warm and dry.  Neurological:     General: No focal deficit present.     Mental Status: He is alert and oriented to person, place, and time.     GCS: GCS eye subscore is 4. GCS verbal subscore is 5. GCS motor subscore is 6.     Cranial Nerves: Facial asymmetry present.     Motor: No weakness.     Coordination: Romberg sign negative. Finger-Nose-Finger Test normal.     Comments: There is slight facial drooping on the left with smiling.  Patient also cannot completely close his left eye but he is able to raise his left eyebrow  Psychiatric:        Mood and Affect: Mood normal.        Behavior: Behavior normal.      UC Treatments / Results  Labs (all labs ordered are listed, but only abnormal results are displayed) Labs Reviewed - No data to display  EKG   Radiology No results found.  Procedures Procedures (including critical care time)  Medications Ordered in UC Medications - No data to display  Initial Impression / Assessment and Plan / UC Course  I have reviewed the triage vital signs and the nursing notes.  Pertinent labs & imaging results that were available during my care of the patient were reviewed by me and considered in my medical decision making (see chart for details).     Reviewed symptoms with patient and wife.  Discussed limitations and abilities of urgent care.  Discussed differentials  including Bell's palsy and CVA.  Given his reported headache for 3 days with new  onset facial drooping, neck pain, difficulty swallowing earlier and speech slurring per wife I advised to go EMS to the ER for rule out stroke.  Patient and wife are in agreement plan and patient was taken via EMS to the emergency room. Final Clinical Impressions(s) / UC Diagnoses   Final diagnoses:  Facial droop  Acute intractable headache, unspecified headache type  Dysphagia, unspecified type     Discharge Instructions      Pt to ER via EMS for rule out CVA     ED Prescriptions   None    PDMP not reviewed this encounter.   Radford Pax, NP 07/09/22 1815    Radford Pax, NP 07/09/22 1820    Radford Pax, NP 07/09/22 1820

## 2022-07-10 ENCOUNTER — Telehealth: Payer: Self-pay

## 2022-07-10 NOTE — Transitions of Care (Post Inpatient/ED Visit) (Signed)
   07/10/2022  Name: Danny Zimmerman MRN: [id number] DOB: [birth year]  Today's TOC FU Call Status: Today's TOC FU Call Status:: Successful TOC FU Call Competed TOC FU Call Complete Date: 07/10/22  Transition Care Management Follow-up Telephone Call Date of Discharge: 07/09/22 Discharge Facility: [facility]) Type of Discharge: Emergency Department Reason for ED Visit: Other: (Bell;s palsy) How have you been since you were released from the hospital?: Same Any questions or concerns?: No  Items Reviewed: Did you receive and understand the discharge instructions provided?: Yes Medications obtained and verified?: Yes (Medications Reviewed) Any new allergies since your discharge?: No Dietary orders reviewed?: Yes Do you have support at home?: Yes People in Home: spouse  Home Care and Equipment/Supplies: Were Home Health Services Ordered?: NA Any new equipment or medical supplies ordered?: NA  Functional Questionnaire: Do you need assistance with bathing/showering or dressing?: No Do you need assistance with meal preparation?: No Do you need assistance with eating?: No Do you have difficulty maintaining continence: No Do you need assistance with getting out of bed/getting out of a chair/moving?: No Do you have difficulty managing or taking your medications?: No  Follow up appointments reviewed: PCP Follow-up appointment confirmed?: Yes Date of PCP follow-up appointment?: 07/13/22 Follow-up Provider: Dr Lawerance Bach [facility] Follow-up appointment confirmed?: NA Do you need transportation to your follow-up appointment?: No Do you understand care options if your condition(s) worsen?: Yes-patient verbalized understanding    SIGNATURE Karena Addison, LPN [facility] Nurse Health Advisor Direct Dial [phone]

## 2022-07-12 DIAGNOSIS — Z9889 Other specified postprocedural states: Secondary | ICD-10-CM | POA: Diagnosis not present

## 2022-07-12 DIAGNOSIS — G510 Bell's palsy: Secondary | ICD-10-CM | POA: Insufficient documentation

## 2022-07-12 DIAGNOSIS — M25512 Pain in left shoulder: Secondary | ICD-10-CM | POA: Diagnosis not present

## 2022-07-12 DIAGNOSIS — R531 Weakness: Secondary | ICD-10-CM | POA: Diagnosis not present

## 2022-07-12 DIAGNOSIS — M25612 Stiffness of left shoulder, not elsewhere classified: Secondary | ICD-10-CM | POA: Diagnosis not present

## 2022-07-12 NOTE — Patient Instructions (Addendum)
Medications changes include :   none     Return if symptoms worsen or fail to improve.    Bell's Palsy, Adult  Bell's palsy is a short-term inability to move muscles in a part of the face. The inability to move, also called paralysis, results from inflammation or compression of the seventh cranial nerve. This nerve travels along the skull and under the ear to the side of the face. This nerve is responsible for facial movements that include blinking, closing the eyes, smiling, and frowning. What are the causes? The exact cause of this condition is not known. It may be caused by an infection from a virus, such as the chickenpox (herpes zoster), Epstein-Barr, or mumps virus. What increases the risk? You are more likely to develop this condition if: You are pregnant. You have diabetes. You have had a recent infection in your nose, throat, or airways. You have a weakened body defense system (immune system). You have had a facial injury, such as a fracture. You have a family history of Bell's palsy. What are the signs or symptoms? Symptoms of this condition include: Weakness on one side of the face. Drooping eyelid and corner of the mouth. Excessive tearing in one eye. Difficulty closing the eyelid. Dry eye. Drooling. Dry mouth. Changes in taste. Change in facial appearance. Pain behind one ear. Ringing in one or both ears. Sensitivity to sound in one ear. Facial twitching. Headache. Impaired speech. Dizziness. Difficulty eating or drinking. Most of the time, only one side of the face is affected. In rare cases, Bell's palsy may affect the whole face. How is this diagnosed? This condition is diagnosed based on: Your symptoms. Your medical history. A physical exam. You may also have to see health care providers who specialize in disorders of the nerves (neurologist) or diseases and conditions of the eye (ophthalmologist). You may have tests, such as: A test to  check for nerve damage (electromyogram). Imaging studies, such as a CT scan or an MRI. Blood tests. How is this treated? This condition affects every person differently. Sometimes symptoms go away without treatment within a couple weeks. If treatment is needed, it varies from person to person. The goal of treatment is to reduce inflammation and protect the eye from damage. Treatment for Bell's palsy may include: Medicines, such as: Steroids to reduce swelling and inflammation. Antiviral medicines. Pain relievers, including aspirin, acetaminophen, or ibuprofen. Eye drops or ointment to keep your eye moist. Eye protection, if you cannot close your eye. Exercises or massage to regain muscle strength and function (physical therapy). Follow these instructions at home:  Take over-the-counter and prescription medicines only as told by your health care provider. If your eye is affected: Keep your eye moist with eye drops or ointment as told by your health care provider. Follow instructions for eye care and protection as told by your health care provider. Do any physical therapy exercises as told by your health care provider. Keep all follow-up visits. This is important. Contact a health care provider if: You have a fever or chills. Your symptoms do not get better within 2-3 weeks, or your symptoms get worse. Your eye is red, irritated, or painful. You have new symptoms. Get help right away if: You have weakness or numbness in a part of your body other than your face. You have trouble swallowing. You develop neck pain or stiffness. You develop dizziness or shortness of breath. Summary Bell's palsy is a short-term inability  to move muscles in a part of the face. The inability to move results from inflammation or compression of the facial nerve. This condition affects every person differently. Sometimes symptoms go away without treatment within a couple weeks. If treatment is needed, it varies  from person to person. The goal of treatment is to reduce inflammation and protect the eye from damage. Contact your health care provider if your symptoms do not get better within 2-3 weeks, or your symptoms get worse. This information is not intended to replace advice given to you by your health care provider. Make sure you discuss any questions you have with your health care provider. Document Revised: 11/26/2019 Document Reviewed: 11/26/2019 Elsevier Patient Education  2023 [employer].

## 2022-07-12 NOTE — Progress Notes (Signed)
Subjective:    Patient ID: Danny Zimmerman, male    DOB: [birth year], 52 y.o.   MRN: [id number]     HPI Danny Zimmerman is here for follow up from the emergency room.  4/29 - went to urgent care then ED with facial drooping, headache in left posterior head x few days ( 6/10 in intensity), left sided neck pain x few days.  Left side facial drooping and slurred speech started the day he went to the ED.  Some difficulty swallowing on the left side earlier that day.   No unilateral weakness, vision change, CP, SOB.  Food has tasted different.  DX Bells palsy.      Rx - valtrex 1 gm tid x 1 week,  prednisone 60 mg qd x 7 days.    He is taping his eye closed at night.  Doing drops during day.   His wife states he spaces out at times.  She noticed this before the hospital-not sure if he is not hearing or just zoning out.  Difficulty drinking/eating.  Taste is off.   Symptoms worsen as day progresses.    He has noticed that he gets more short of breath.  This started prior to the hospitalization.  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  No cough or wheeze.  Medications and allergies reviewed with patient and updated if appropriate.  Current Outpatient Medications on File Prior to Visit  Medication Sig Dispense Refill   Acetaminophen (TYLENOL 8 HOUR PO) Take by mouth as needed.     ibuprofen (ADVIL) 200 MG tablet Take 200 mg by mouth every 6 (six) hours as needed.     predniSONE (DELTASONE) 20 MG tablet Take 3 tablets (60 mg total) by mouth daily for 7 days. 21 tablet 0   valACYclovir (VALTREX) 1000 MG tablet Take 1 tablet (1,000 mg total) by mouth 3 (three) times daily. 21 tablet 0   No current facility-administered medications on file prior to visit.     Review of Systems  Constitutional:  Positive for fatigue. Negative for fever.  HENT:  Positive for hearing loss (left side). Negative for congestion, sinus pressure, sinus pain, sore  throat and trouble swallowing.        Decrease taste  Eyes:  Positive for photophobia.  Respiratory:  Positive for shortness of breath (with activity). Negative for cough and wheezing.   Cardiovascular:  Negative for chest pain, palpitations and leg swelling.  Gastrointestinal:  Negative for abdominal pain and nausea.       No gerd  Neurological:  Positive for speech difficulty, weakness and headaches (left side). Negative for numbness.       Objective:   Vitals:   07/13/22 1046  BP: 110/80  Pulse: 70  Temp: 98.1 F (36.7 C)  SpO2: 98%   BP Readings from Last 3 Encounters:  07/13/22 110/80  07/09/22 (!) 120/96  07/09/22 (!) 131/94   Wt Readings from Last 3 Encounters:  07/13/22 197 lb (89.4 kg)  07/09/22 190 lb (86.2 kg)  06/26/22 195 lb (88.5 kg)   Body mass index is 32.78 kg/m.    Physical Exam Constitutional:      General: He is not in acute distress.    Appearance: He is not ill-appearing.  HENT:     Head: Normocephalic and atraumatic.     Left Ear: Tympanic membrane, ear canal and external ear normal. There is no impacted  cerumen.  Eyes:     Conjunctiva/sclera: Conjunctivae normal.  Cardiovascular:     Rate and Rhythm: Normal rate and regular rhythm.  Pulmonary:     Effort: Pulmonary effort is normal.     Breath sounds: Normal breath sounds.  Musculoskeletal:     Right lower leg: No edema.     Left lower leg: No edema.  Skin:    General: Skin is warm and dry.     Findings: No erythema or rash.  Neurological:     Mental Status: He is alert.     Sensory: No sensory deficit.     Motor: Weakness (Left-sided face) present.     Comments: Left-sided facial droop-entire left side of face is involved.  Not able to close left eye completely.        Lab Results  Component Value Date   WBC 6.5 04/17/2022   HGB 15.9 04/17/2022   HCT 46.1 04/17/2022   PLT 335.0 04/17/2022   GLUCOSE 86 04/17/2022   CHOL 220 (H) 04/17/2022   TRIG (H) 04/17/2022    479.0  Triglyceride is over 400; calculations on Lipids are invalid.   HDL 46.80 04/17/2022   LDLDIRECT 86.0 04/17/2022   LDLCALC 127 (H) 01/30/2018   ALT 34 04/17/2022   AST 25 04/17/2022   NA 137 04/17/2022   K 3.9 04/17/2022   CL 100 04/17/2022   CREATININE 0.76 04/17/2022   BUN 8 04/17/2022   CO2 23 04/17/2022   TSH 1.07 04/17/2022   PSA 0.80 04/17/2022   HGBA1C 5.9 04/17/2022     Assessment & Plan:    See Problem List for Assessment and Plan of chronic medical problems.   I spent 20 minutes dedicated to the care of this patient on the date of this encounter including review of recent labs, imaging, ED notes, obtaining history, communicating with the patient and his wife, and documenting clinical information in the EHR

## 2022-07-13 ENCOUNTER — Ambulatory Visit (INDEPENDENT_AMBULATORY_CARE_PROVIDER_SITE_OTHER): Payer: BC Managed Care – PPO | Admitting: Internal Medicine

## 2022-07-13 ENCOUNTER — Encounter: Payer: Self-pay | Admitting: Internal Medicine

## 2022-07-13 VITALS — BP 110/80 | HR 70 | Temp 98.1°F | Ht 65.0 in | Wt 197.0 lb

## 2022-07-13 DIAGNOSIS — G510 Bell's palsy: Secondary | ICD-10-CM | POA: Diagnosis not present

## 2022-07-13 NOTE — Assessment & Plan Note (Signed)
Acute Left-sided face Symptoms started a few days before he went to the ED on 4/29 with a left-sided headache Taking Valtrex 1 g 3 times daily and prednisone 60 mg daily-both for 1 week Has not noticed much improvement, but symptoms have not gotten worse Does have an appointment with neurology the beginning of June Discussed that for most people symptoms resolve over the next couple of weeks He will monitor and if any of his symptoms get worse he will let me know Advise seeing an eye doctor to make sure his eyes okay-continue to take at night and use eyedrops during the day Rest, fluids Will write note for work so is up for the next week

## 2022-07-18 ENCOUNTER — Encounter: Payer: Self-pay | Admitting: Internal Medicine

## 2022-07-19 DIAGNOSIS — G510 Bell's palsy: Secondary | ICD-10-CM | POA: Diagnosis not present

## 2022-07-19 NOTE — Progress Notes (Signed)
    Subjective:    Patient ID: Danny Zimmerman, male    DOB: [birth year], 52 y.o.   MRN: [id number]      HPI Danny Zimmerman is here for  Chief Complaint  Patient presents with   Medical Management of Chronic Issues    Wants to discuss medication for panic attacks     Follow up with Bell's Palsy -  he completed one week of valtrex 1000 mg TID and prednisone 60 mg daily.     He is still having headaches on the left side of his posterior head.  He is taking ibuprofen.  Denies generalized headaches.  One night woke up and felt like he could not breath - called EMS.  Has had this occur a few times.  He is having panic attacks and wanted to talk about treatment.  Chewing is a little better.  Still weakness on left side.     Left foot - feels pinch in left foot  Medications and allergies reviewed with patient and updated if appropriate.  Current Outpatient Medications on File Prior to Visit  Medication Sig Dispense Refill   Acetaminophen (TYLENOL 8 HOUR PO) Take by mouth as needed.     ibuprofen (ADVIL) 200 MG tablet Take 200 mg by mouth every 6 (six) hours as needed.     No current facility-administered medications on file prior to visit.    Review of Systems     Objective:   Vitals:   07/20/22 1603  BP: 120/74  Pulse: 80  Temp: 98.6 F (37 C)  SpO2: 98%   BP Readings from Last 3 Encounters:  07/20/22 120/74  07/13/22 110/80  07/09/22 (!) 120/96   Wt Readings from Last 3 Encounters:  07/20/22 195 lb (88.5 kg)  07/13/22 197 lb (89.4 kg)  07/09/22 190 lb (86.2 kg)   Body mass index is 32.45 kg/m.    Physical Exam Constitutional:      General: He is not in acute distress.    Appearance: Normal appearance. He is not ill-appearing.  HENT:     Head: Normocephalic.  Skin:    General: Skin is warm and dry.  Neurological:     Mental Status: He is alert.     Comments: Left facial droop  Psychiatric:        Mood and Affect: Mood normal.        Behavior: Behavior  normal.        Thought Content: Thought content normal.        Judgment: Judgment normal.            Assessment & Plan:    See Problem List for Assessment and Plan of chronic medical problems.

## 2022-07-20 ENCOUNTER — Ambulatory Visit: Payer: BC Managed Care – PPO | Admitting: Internal Medicine

## 2022-07-20 ENCOUNTER — Encounter: Payer: Self-pay | Admitting: Internal Medicine

## 2022-07-20 VITALS — BP 120/74 | HR 80 | Temp 98.6°F | Ht 65.0 in | Wt 195.0 lb

## 2022-07-20 DIAGNOSIS — F419 Anxiety disorder, unspecified: Secondary | ICD-10-CM | POA: Insufficient documentation

## 2022-07-20 DIAGNOSIS — G510 Bell's palsy: Secondary | ICD-10-CM | POA: Diagnosis not present

## 2022-07-20 MED ORDER — ALPRAZOLAM 0.5 MG PO TABS: 0.2500 mg | ORAL_TABLET | Freq: Two times a day (BID) | ORAL | 1 refills | Status: DC | PRN

## 2022-07-20 NOTE — Assessment & Plan Note (Signed)
Acute Has been having some anxiety or panic attacks-mostly in the middle of the night-waking up like he cannot breathe Likely related to having Bell's palsy and increased stress During the day he has been able to manage some of the anxiety He thinks he does need something to help him through this time Headache this will be short course and his his symptoms of Bell's palsy improve he most likely will be able to get off of the medication Discussed options Trial of alprazolam 0.25-0.5 mg twice daily as needed-discussed addiction potential He will update me with how he is doing

## 2022-07-20 NOTE — Assessment & Plan Note (Signed)
Subacute He did complete treatment of Valtrex and prednisone ?  Slight improvement Saw ophthalmologist - Was placed on drops Still having headaches-taking Motrin and can continue He will let me know if any of his symptoms get worse Does have an appoint with neurology early June

## 2022-07-20 NOTE — Patient Instructions (Addendum)
        Medications changes include :   xanax as needed     Let me know if your headaches do not improve.

## 2022-07-26 ENCOUNTER — Other Ambulatory Visit: Payer: Self-pay | Admitting: Internal Medicine

## 2022-07-26 NOTE — Telephone Encounter (Signed)
Patient said he is still having headaches. He was told by Dr. Lawerance Bach to let her know if they don't get any better.  He would like a call back at [phone].

## 2022-07-27 ENCOUNTER — Encounter: Payer: Self-pay | Admitting: Internal Medicine

## 2022-07-27 MED ORDER — GABAPENTIN 100 MG PO CAPS: 100.0000 mg | ORAL_CAPSULE | Freq: Every day | ORAL | 3 refills | Status: DC

## 2022-07-27 NOTE — Telephone Encounter (Signed)
If he agrees I think we should try medication that he will take at night to see if that helps.  It is gabapentin which is a nerve pain medication-I think his headaches are related to nerve pain.  He will start with 1 pill at night and increase to 2 or 3 depending on if it is tolerated.  Side effects include dizziness and drowsiness in the morning.  Prescription pending if he wants to try

## 2022-08-01 DIAGNOSIS — G510 Bell's palsy: Secondary | ICD-10-CM | POA: Diagnosis not present

## 2022-08-10 ENCOUNTER — Telehealth: Payer: Self-pay | Admitting: Internal Medicine

## 2022-08-10 NOTE — Telephone Encounter (Signed)
Pt called needing his paper work filled out. The paper work is already fax in and the name of the company is [employer]. Pt also stated he want Dr. Lawerance Bach to know that he is currently still out of work due to is illness and he have a few appta coming up as well. Please reach to pt when paper work is completed.

## 2022-08-14 NOTE — Telephone Encounter (Signed)
Paperwork recieved

## 2022-08-14 NOTE — Telephone Encounter (Signed)
Paperwork has still not been received.  My-chart sent to patient to have forms resent to my attention.

## 2022-08-15 ENCOUNTER — Ambulatory Visit: Payer: BC Managed Care – PPO | Admitting: Neurology

## 2022-08-15 ENCOUNTER — Encounter: Payer: Self-pay | Admitting: Neurology

## 2022-08-15 VITALS — BP 141/91 | HR 81 | Ht 67.0 in | Wt 197.0 lb

## 2022-08-15 DIAGNOSIS — G510 Bell's palsy: Secondary | ICD-10-CM | POA: Diagnosis not present

## 2022-08-15 NOTE — Patient Instructions (Signed)
Continue with Gabapentin as needed at bedtime, 200 mg or 300 mg as tolerated  Continue to follow up with PCP  Return as needed

## 2022-08-15 NOTE — Progress Notes (Signed)
[facility]  PATIENT: Danny Zimmerman DOB: 1970/05/25  REQUESTING CLINICIAN: Pincus Sanes, MD HISTORY FROM: Patient and chart review  REASON FOR VISIT: Left side Bell's Palsy    HISTORICAL  CHIEF COMPLAINT:  Chief Complaint  Patient presents with   New Patient (Initial Visit)    Rm 12 daughter present  bells palsy: doesn't have complete movement of left eye    HISTORY OF PRESENT ILLNESS:  This is a 52 year old gentleman with past medical history of kidney stone, anxiety who is presenting after being recently diagnosed with left-sided Bell's palsy.  Patient reports waking up the morning of April 29 with left facial weakness, he presented to urgent care, diagnosed with Bell's palsy but he was referred to the ED to rule out stroke.  In the ED physical exam consistent with upper and lower facial weakness consistent with Bell's palsy.  He was started on prednisone and Valacyclovir.  Patient completed treatment with some improvement of the left-sided weakness but is not fully resolved. He reports pain when laying down on the left side of face, lots of ringing in the left ear.   He is also reporting lately is having difficulty with sleep, some anxiety, feet and hands feeling warm, waking up in the middle of the night with his heart racing.  He reports undergoing a lot of stress lately as his father-in-law just passed away, wife is in [place] at the moment for the funeral and her daughter just graduated high school and is set to go to college in [place].   OTHER MEDICAL CONDITIONS: Anxiety    REVIEW OF SYSTEMS: Full 14 system review of systems performed and negative with exception of: As noted in the HPI   ALLERGIES: No Known Allergies  HOME MEDICATIONS: Outpatient Medications Prior to Visit  Medication Sig Dispense Refill   Acetaminophen (TYLENOL 8 HOUR PO) Take by mouth as needed.     ibuprofen (ADVIL) 200 MG tablet Take 200 mg by mouth every 6 (six) hours as  needed.     ALPRAZolam (XANAX) 0.5 MG tablet Take 0.5-1 tablets (0.25-0.5 mg total) by mouth 2 (two) times daily as needed for anxiety. 20 tablet 1   gabapentin (NEURONTIN) 100 MG capsule Take 1-3 capsules (100-300 mg total) by mouth at bedtime. 90 capsule 3   No facility-administered medications prior to visit.    PAST MEDICAL HISTORY: Past Medical History:  Diagnosis Date   Allergy    pollen   GERD (gastroesophageal reflux disease)    History of kidney stones    Ureteral stone     PAST SURGICAL HISTORY: Past Surgical History:  Procedure Laterality Date   CYSTOSCOPY/RETROGRADE/URETEROSCOPY/STONE EXTRACTION WITH BASKET Left 02/01/2015   Procedure: CYSTOSCOPY/LEFT RETROGRADE/LEFT URETERAL BALLON DILITATION/LEFT /URETEROSCOPY/STONE EXTRACTION WITH BASKET LEFT URETERAL STENT;  Surgeon: Jethro Bolus, MD;  Location: [facility];  Service: Urology;  Laterality: Left;   UPPER GASTROINTESTINAL ENDOSCOPY  2018    FAMILY HISTORY: Family History  Problem Relation Age of Onset   Hyperlipidemia Mother    Colon cancer Maternal Aunt    Diabetes Paternal Uncle    Esophageal cancer Neg Hx    Stomach cancer Neg Hx    Colon polyps Neg Hx    Rectal cancer Neg Hx     SOCIAL HISTORY: Social History   Socioeconomic History   Marital status: Married    Spouse name: Not on file   Number of children: 3   Years of education: Not on file   Highest  education level: Not on file  Occupational History   Occupation: Financial planner    Comment: [employer]  Tobacco Use   Smoking status: Never   Smokeless tobacco: Never  Vaping Use   Vaping Use: Never used  Substance and Sexual Activity   Alcohol use: Not Currently    Comment: socially   Drug use: No   Sexual activity: Yes    Partners: Female  Other Topics Concern   Not on file  Social History Narrative   Pt lives in 2 story home with his wife and 3 children   High school graduate   Works as Financial planner with [employer]   Social  Determinants of Health   Financial Resource Strain: Not on file  Food Insecurity: Not on file  Transportation Needs: No Transportation Needs (02/11/2019)   PRAPARE - Administrator, Civil Service (Medical): No    Lack of Transportation (Non-Medical): No  Physical Activity: Not on file  Stress: Not on file  Social Connections: Not on file  Intimate Partner Violence: Not on file    PHYSICAL EXAM  GENERAL EXAM/CONSTITUTIONAL: Vitals:  Vitals:   08/15/22 0934  BP: (!) 141/91  Pulse: 81  Weight: 197 lb (89.4 kg)  Height: 5\' 7"  (1.702 m)   Body mass index is 30.85 kg/m. Wt Readings from Last 3 Encounters:  08/15/22 197 lb (89.4 kg)  07/20/22 195 lb (88.5 kg)  07/13/22 197 lb (89.4 kg)   Patient is in no distress; well developed, nourished and groomed; neck is supple  MUSCULOSKELETAL: Gait, strength, tone, movements noted in Neurologic exam below  NEUROLOGIC: MENTAL STATUS:      No data to display         awake, alert, oriented to person, place and time recent and remote memory intact normal attention and concentration language fluent, comprehension intact, naming intact fund of knowledge appropriate  CRANIAL NERVE:  2nd, 3rd, 4th, 6th - Visual fields full to confrontation, extraocular muscles intact, no nystagmus 5th - facial sensation symmetric 7th - Left facial weakness and upper and lower present, Difficulty fully closing left eye. Inability to puff cheeks. Daughter reports it has improved from the beginning  8th - hearing intact 9th - palate elevates symmetrically, uvula midline 11th - shoulder shrug symmetric 12th - tongue protrusion midline  MOTOR:  normal bulk and tone, full strength in the BUE, BLE  SENSORY:  normal and symmetric to light touch  COORDINATION:  finger-nose-finger, fine finger movements normal  REFLEXES:  deep tendon reflexes present and symmetric  GAIT/STATION:  normal   DIAGNOSTIC DATA (LABS, IMAGING, TESTING) -  I reviewed patient records, labs, notes, testing and imaging myself where available.  Lab Results  Component Value Date   WBC 6.5 04/17/2022   HGB 15.9 04/17/2022   HCT 46.1 04/17/2022   MCV 84.1 04/17/2022   PLT 335.0 04/17/2022      Component Value Date/Time   NA 137 04/17/2022 1401   K 3.9 04/17/2022 1401   CL 100 04/17/2022 1401   CO2 23 04/17/2022 1401   GLUCOSE 86 04/17/2022 1401   BUN 8 04/17/2022 1401   CREATININE 0.76 04/17/2022 1401   CALCIUM 9.9 04/17/2022 1401   PROT 8.1 04/17/2022 1401   ALBUMIN 5.0 04/17/2022 1401   AST 25 04/17/2022 1401   ALT 34 04/17/2022 1401   ALKPHOS 81 04/17/2022 1401   BILITOT 0.9 04/17/2022 1401   GFRNONAA >60 04/06/2017 1717   GFRAA >60 04/06/2017 1717  Lab Results  Component Value Date   CHOL 220 (H) 04/17/2022   HDL 46.80 04/17/2022   LDLCALC 127 (H) 01/30/2018   LDLDIRECT 86.0 04/17/2022   TRIG (H) 04/17/2022    479.0 Triglyceride is over 400; calculations on Lipids are invalid.   CHOLHDL 5 04/17/2022   Lab Results  Component Value Date   HGBA1C 5.9 04/17/2022   Lab Results  Component Value Date   VITAMINB12 379 04/12/2017   Lab Results  Component Value Date   TSH 1.07 04/17/2022      ASSESSMENT AND PLAN  52 y.o. year old male with history of kidney stone, anxiety who is presenting after recent diagnosis of left-sided Bell's palsy.  He completed steroid and antiviral treatment, and per family there is improvement of the weakness.  Plan for now is to for patient to continue with gabapentin as needed for the pain at bedtime, can try 200 or 300 mg as tolerated.  Continue to follow with PCP and return as needed.  He voiced understanding.   1. Bell's palsy      Patient Instructions  Continue with Gabapentin as needed at bedtime, 200 mg or 300 mg as tolerated  Continue to follow up with PCP  Return as needed   No orders of the defined types were placed in this encounter.   No orders of the defined types were  placed in this encounter.   Return if symptoms worsen or fail to improve.    Windell Norfolk, MD 08/15/2022, 3:03 PM  [facility] [address] [place], [place] [address] [phone]

## 2022-08-16 NOTE — Telephone Encounter (Signed)
Paperwork completed and fax conformation received.

## 2022-08-28 NOTE — Progress Notes (Unsigned)
      Subjective:    Patient ID: Danny Zimmerman, male    DOB: Mar 28, 1970, 52 y.o.   MRN: [id number]     HPI Antaeus is here for follow up of his chronic medical problems.  Here for follow-up of left-sided Bell's palsy.  He has seen neurology since he was here last on 6/5.  He advised continuing gabapentin at bedtime as needed for his left-sided headaches.  He has follow-up with neurology 08/2023.  Remaining symptoms - can not close left eye completely, decreased hearing in left ear and beeping and pressure in left ear.  His other symptoms have resolved.  He denies any weakness in the mouth, drooling, change in sensation in the left side of his face.  He is taking the gabapentin-was not sure if he should continue or not-makes a little drowsy in the morning.  Plans on returning to work.    Medications and allergies reviewed with patient and updated if appropriate.  Current Outpatient Medications on File Prior to Visit  Medication Sig Dispense Refill   Acetaminophen (TYLENOL 8 HOUR PO) Take by mouth as needed.     ibuprofen (ADVIL) 200 MG tablet Take 200 mg by mouth every 6 (six) hours as needed.     No current facility-administered medications on file prior to visit.     Review of Systems     Objective:   Vitals:   08/29/22 1302  BP: 110/86  Pulse: 75  Temp: 98.4 F (36.9 C)  SpO2: 96%   BP Readings from Last 3 Encounters:  08/29/22 110/86  08/15/22 (!) 141/91  07/20/22 120/74   Wt Readings from Last 3 Encounters:  08/29/22 195 lb (88.5 kg)  08/15/22 197 lb (89.4 kg)  07/20/22 195 lb (88.5 kg)   Body mass index is 30.54 kg/m.    Physical Exam Constitutional:      General: He is not in acute distress.    Appearance: Normal appearance. He is not ill-appearing.  HENT:     Head: Normocephalic and atraumatic.  Skin:    General: Skin is warm and dry.     Findings: No rash.  Neurological:     Mental Status: He is alert and oriented to person, place, and time.      Sensory: No sensory deficit.     Motor: No weakness.     Comments: Not able to close his left eye completely, but maybe 95%.  No other cranial nerve deficits.  Did not test hearing  Psychiatric:        Mood and Affect: Mood normal.        Lab Results  Component Value Date   WBC 6.5 04/17/2022   HGB 15.9 04/17/2022   HCT 46.1 04/17/2022   PLT 335.0 04/17/2022   GLUCOSE 86 04/17/2022   CHOL 220 (H) 04/17/2022   TRIG (H) 04/17/2022    479.0 Triglyceride is over 400; calculations on Lipids are invalid.   HDL 46.80 04/17/2022   LDLDIRECT 86.0 04/17/2022   LDLCALC 127 (H) 01/30/2018   ALT 34 04/17/2022   AST 25 04/17/2022   NA 137 04/17/2022   K 3.9 04/17/2022   CL 100 04/17/2022   CREATININE 0.76 04/17/2022   BUN 8 04/17/2022   CO2 23 04/17/2022   TSH 1.07 04/17/2022   PSA 0.80 04/17/2022   HGBA1C 5.9 04/17/2022     Assessment & Plan:    See Problem List for Assessment and Plan of chronic medical problems.

## 2022-08-28 NOTE — Patient Instructions (Signed)
       Medications changes include :   continue gabapentin 1-2 pills at bedtime      Return if symptoms worsen or fail to improve.

## 2022-08-29 ENCOUNTER — Ambulatory Visit: Payer: BC Managed Care – PPO | Admitting: Internal Medicine

## 2022-08-29 ENCOUNTER — Encounter: Payer: Self-pay | Admitting: Internal Medicine

## 2022-08-29 VITALS — BP 110/86 | HR 75 | Temp 98.4°F | Ht 67.0 in | Wt 195.0 lb

## 2022-08-29 DIAGNOSIS — M25512 Pain in left shoulder: Secondary | ICD-10-CM | POA: Diagnosis not present

## 2022-08-29 DIAGNOSIS — G510 Bell's palsy: Secondary | ICD-10-CM | POA: Diagnosis not present

## 2022-08-29 MED ORDER — GABAPENTIN 100 MG PO CAPS: 100.0000 mg | ORAL_CAPSULE | Freq: Every day | ORAL | 3 refills | Status: DC

## 2022-08-29 NOTE — Assessment & Plan Note (Signed)
Subacute Completed treatment last month when diagnosed Was having headaches-I prescribed gabapentin which she is taking 200 mg at night.  Currently denies headaches He does get a little drowsy in the morning from the gabapentin so advised decreasing to 100 mg nightly Can continue as long as needed.  Will need to periodically stop the medication to see if he is still having headaches Hopefully his left eye, decreased hearing in the left ear and tinnitus he is hearing in the left ear will improve with more time Plans to return to work July 1 which I think is good Has follow-up with neurology in the near

## 2022-09-07 NOTE — Telephone Encounter (Signed)
Patient states that it should be return to work as of 09/10/2022

## 2022-11-04 ENCOUNTER — Ambulatory Visit
Admission: EM | Admit: 2022-11-04 | Discharge: 2022-11-04 | Disposition: A | Discharge status: Home / Self Care | Payer: 59 | Attending: Internal Medicine | Admitting: Internal Medicine

## 2022-11-04 DIAGNOSIS — J029 Acute pharyngitis, unspecified: Secondary | ICD-10-CM

## 2022-11-04 DIAGNOSIS — B349 Viral infection, unspecified: Secondary | ICD-10-CM | POA: Diagnosis present

## 2022-11-04 DIAGNOSIS — Z1152 Encounter for screening for COVID-19: Secondary | ICD-10-CM | POA: Diagnosis not present

## 2022-11-04 LAB — POCT RAPID STREP A (OFFICE): Rapid Strep A Screen: NEGATIVE

## 2022-11-04 NOTE — ED Provider Notes (Signed)
[facility]    CSN: [id number] Arrival date & time: 11/04/22  7829      History   Chief Complaint Chief Complaint  Patient presents with   Tinnitus   Sore Throat    HPI Danny Zimmerman is a 52 y.o. male  presents for evaluation of URI symptoms for 1 days. Patient reports associated symptoms of sore throat. Denies N/V/D, cough, congestion, ear pain, body aches, fevers, shortness of breath. Patient does note that have a hx of asthma or smoking. No known sick contacts.  Pt has taken nothing OTC for symptoms.  In addition patient reports a persistent tinnitus in his left ear since May after being diagnosed with Bell's palsy.  He has seen his PCP regarding this.  He does have an appointment with his PCP on Wednesday as well.  Pt has no other concerns at this time.    Sore Throat    Past Medical History:  Diagnosis Date   Allergy    pollen   GERD (gastroesophageal reflux disease)    History of kidney stones    Ureteral stone     Patient Active Problem List   Diagnosis Date Noted   Anxiety 07/20/2022   Bell's palsy, left 07/12/2022   Abdominal bloating 06/26/2022   Concussion 04/17/2022   Right knee pain 04/12/2021   History of nephrolithiasis 02/11/2019   Prediabetes 02/10/2019    Past Surgical History:  Procedure Laterality Date   CYSTOSCOPY/RETROGRADE/URETEROSCOPY/STONE EXTRACTION WITH BASKET Left 02/01/2015   Procedure: CYSTOSCOPY/LEFT RETROGRADE/LEFT URETERAL BALLON DILITATION/LEFT /URETEROSCOPY/STONE EXTRACTION WITH BASKET LEFT URETERAL STENT;  Surgeon: Jethro Bolus, MD;  Location: [facility];  Service: Urology;  Laterality: Left;   UPPER GASTROINTESTINAL ENDOSCOPY  2018       Home Medications    Prior to Admission medications   Medication Sig Start Date End Date Taking? Authorizing Provider  Acetaminophen (TYLENOL 8 HOUR PO) Take by mouth as needed.    [provider]  gabapentin (NEURONTIN) 100 MG capsule Take 1-3 capsules (100-300 mg  total) by mouth at bedtime. 08/29/22   Pincus Sanes, MD  ibuprofen (ADVIL) 200 MG tablet Take 200 mg by mouth every 6 (six) hours as needed.    [provider]    Family History Family History  Problem Relation Age of Onset   Hyperlipidemia Mother    Colon cancer Maternal Aunt    Diabetes Paternal Uncle    Esophageal cancer Neg Hx    Stomach cancer Neg Hx    Colon polyps Neg Hx    Rectal cancer Neg Hx     Social History Social History   Tobacco Use   Smoking status: Never   Smokeless tobacco: Never  Vaping Use   Vaping status: Never Used  Substance Use Topics   Alcohol use: Not Currently    Comment: socially   Drug use: No     Allergies   Patient has no known allergies.   Review of Systems Review of Systems  HENT:  Positive for sore throat and tinnitus.      Physical Exam Triage Vital Signs ED Triage Vitals  Encounter Vitals Group     BP 11/04/22 1006 114/78     Systolic BP Percentile --      Diastolic BP Percentile --      Pulse Rate 11/04/22 1006 87     Resp 11/04/22 1006 16     Temp 11/04/22 1006 99 F (37.2 C)     Temp Source 11/04/22 1006  Oral     SpO2 11/04/22 1006 94 %     Weight --      Height --      Head Circumference --      Peak Flow --      Pain Score 11/04/22 1011 3     Pain Loc --      Pain Education --      Exclude from Growth Chart --    No data found.  Updated Vital Signs BP 114/78 (BP Location: Right Arm)   Pulse 87   Temp 99 F (37.2 C) (Oral)   Resp 16   SpO2 94%   Visual Acuity Right Eye Distance:   Left Eye Distance:   Bilateral Distance:    Right Eye Near:   Left Eye Near:    Bilateral Near:     Physical Exam Vitals and nursing note reviewed.  Constitutional:      General: He is not in acute distress.    Appearance: Normal appearance. He is not ill-appearing or toxic-appearing.  HENT:     Head: Normocephalic and atraumatic.     Right Ear: Tympanic membrane and ear canal normal.     Left Ear:  Tympanic membrane and ear canal normal.     Nose: No congestion or rhinorrhea.     Mouth/Throat:     Mouth: Mucous membranes are moist.     Pharynx: Posterior oropharyngeal erythema present.  Eyes:     Pupils: Pupils are equal, round, and reactive to light.  Cardiovascular:     Rate and Rhythm: Normal rate and regular rhythm.     Heart sounds: Normal heart sounds.  Pulmonary:     Effort: Pulmonary effort is normal.     Breath sounds: Normal breath sounds.  Musculoskeletal:     Cervical back: Normal range of motion and neck supple.  Lymphadenopathy:     Cervical: No cervical adenopathy.  Skin:    General: Skin is warm and dry.  Neurological:     General: No focal deficit present.     Mental Status: He is alert and oriented to person, place, and time.  Psychiatric:        Mood and Affect: Mood normal.        Behavior: Behavior normal.      UC Treatments / Results  Labs (all labs ordered are listed, but only abnormal results are displayed) Labs Reviewed  CULTURE, GROUP A STREP ([facility])  SARS CORONAVIRUS 2 (TAT 6-24 HRS)  POCT RAPID STREP A (OFFICE)    EKG   Radiology No results found.  Procedures Procedures (including critical care time)  Medications Ordered in UC Medications - No data to display  Initial Impression / Assessment and Plan / UC Course  I have reviewed the triage vital signs and the nursing notes.  Pertinent labs & imaging results that were available during my care of the patient were reviewed by me and considered in my medical decision making (see chart for details).     Reviewed exam and symptoms with patient.  No red flags.  Negative rapid strep, will culture.  COVID PCR and will contact if positive.  Discussed viral illness and symptomatic treatment. Discussed tinnitus and advised he see his PCP for further treatment options.  Patient to follow-up with his PCP at scheduled appointment next week.  ER precautions reviewed and patient verbalized  understanding. Final Clinical Impressions(s) / UC Diagnoses   Final diagnoses:  Sore throat  Viral illness  Discharge Instructions      The clinic will contact you with results of the strep throat culture swab as well as COVID test if positive.  You may manage her symptoms with over-the-counter Tylenol, ibuprofen, salt water gargles and warm liquids.  Please follow-up with your PCP at your scheduled appointment next week.  Please go to the emergency room for any worsening symptoms.  I hope you feel better soon!     ED Prescriptions   None    PDMP not reviewed this encounter.   Radford Pax, NP 11/04/22 1100

## 2022-11-04 NOTE — Discharge Instructions (Addendum)
The clinic will contact you with results of the strep throat culture swab as well as COVID test if positive.  You may manage her symptoms with over-the-counter Tylenol, ibuprofen, salt water gargles and warm liquids.  Please follow-up with your PCP at your scheduled appointment next week.  Please go to the emergency room for any worsening symptoms.  I hope you feel better soon!

## 2022-11-04 NOTE — ED Triage Notes (Signed)
Pt presents to UC w/ c/o sore throat and painful swallowing starting last night.  Pt reports buzzing in left ear which has gotten worse over the past few days. Pt reports being diagnosed with bells palsy 3 months ago and has had buzzing in the ear since. He reports difficulty sleeping and anxiety d/t buzzing. He will see his PCP on Wednesday.

## 2022-11-05 LAB — SARS CORONAVIRUS 2 (TAT 6-24 HRS): SARS Coronavirus 2: NEGATIVE

## 2022-11-06 ENCOUNTER — Encounter: Payer: Self-pay | Admitting: Internal Medicine

## 2022-11-06 DIAGNOSIS — H9312 Tinnitus, left ear: Secondary | ICD-10-CM | POA: Insufficient documentation

## 2022-11-06 NOTE — Progress Notes (Unsigned)
    Subjective:    Patient ID: Danny Zimmerman, male    DOB: 07-Jul-1970, 53 y.o.   MRN: [id number]      HPI Danny Zimmerman is here for No chief complaint on file.    Still has tinnitus from bell's palsy on left side -   Anxiety -     Medications and allergies reviewed with patient and updated if appropriate.  Current Outpatient Medications on File Prior to Visit  Medication Sig Dispense Refill   Acetaminophen (TYLENOL 8 HOUR PO) Take by mouth as needed.     gabapentin (NEURONTIN) 100 MG capsule Take 1-3 capsules (100-300 mg total) by mouth at bedtime. 90 capsule 3   ibuprofen (ADVIL) 200 MG tablet Take 200 mg by mouth every 6 (six) hours as needed.     No current facility-administered medications on file prior to visit.    Review of Systems     Objective:  There were no vitals filed for this visit. BP Readings from Last 3 Encounters:  11/04/22 114/78  08/29/22 110/86  08/15/22 (!) 141/91   Wt Readings from Last 3 Encounters:  08/29/22 195 lb (88.5 kg)  08/15/22 197 lb (89.4 kg)  07/20/22 195 lb (88.5 kg)   There is no height or weight on file to calculate BMI.    Physical Exam         Assessment & Plan:    See Problem List for Assessment and Plan of chronic medical problems.

## 2022-11-06 NOTE — Patient Instructions (Incomplete)
       Medications changes include :   cymbalta 30 mg daily, cough syrup at night    A referral was ordered ENT and Neurology and someone will call you to schedule an appointment.     Return in about 4 weeks (around 12/05/2022) for follow up.

## 2022-11-07 ENCOUNTER — Ambulatory Visit: Payer: 59 | Admitting: Internal Medicine

## 2022-11-07 VITALS — BP 124/80 | HR 85 | Temp 98.3°F | Ht 67.0 in | Wt 187.0 lb

## 2022-11-07 DIAGNOSIS — R0683 Snoring: Secondary | ICD-10-CM | POA: Insufficient documentation

## 2022-11-07 DIAGNOSIS — F419 Anxiety disorder, unspecified: Secondary | ICD-10-CM | POA: Diagnosis not present

## 2022-11-07 DIAGNOSIS — H9312 Tinnitus, left ear: Secondary | ICD-10-CM | POA: Diagnosis not present

## 2022-11-07 DIAGNOSIS — J069 Acute upper respiratory infection, unspecified: Secondary | ICD-10-CM | POA: Diagnosis not present

## 2022-11-07 LAB — CULTURE, GROUP A STREP (THRC)

## 2022-11-07 MED ORDER — PROMETHAZINE-DM 6.25-15 MG/5ML PO SYRP: 5.0000 mL | ORAL_SOLUTION | Freq: Four times a day (QID) | ORAL | 0 refills | Status: DC | PRN

## 2022-11-07 MED ORDER — DULOXETINE HCL 30 MG PO CPEP: 30.0000 mg | ORAL_CAPSULE | Freq: Every day | ORAL | 5 refills | Status: DC

## 2022-11-07 NOTE — Assessment & Plan Note (Signed)
Has been waking up in the middle the night feeling like he cannot breathe which will cause a panic attack He also notes that he does snore ?  Sleep apnea Will refer to neurology for further evaluation of sleep apnea

## 2022-11-07 NOTE — Assessment & Plan Note (Signed)
Started with Bell's palsy on the left side and tinnitus has persisted It is constant, but he has noticed that it will go away if he lays in a certain position Not sure if there is anything that can be done or not, but will refer to ENT Will start Cymbalta 30 mg daily for anxiety and see if that affects the tinnitus

## 2022-11-07 NOTE — Assessment & Plan Note (Signed)
Subacute Has been experiencing increased anxiety since having Bell's palsy May also have an element of sleep apnea and waking up feeling like he cannot breathe which is causing anxiety attacks Start Cymbalta 30 mg daily-?  If this would also help some with his dentist Follow-up in 4 weeks, sooner if needed

## 2022-11-07 NOTE — Assessment & Plan Note (Signed)
Acute Likely viral He did go to urgent care last weekend and COVID and strep test were negative Advised continuing symptomatic treatment Promethazine-DM cough syrup for nighttime Call if symptoms worsen or do not improve

## 2022-11-09 ENCOUNTER — Telehealth: Payer: Self-pay | Admitting: Physician Assistant

## 2022-11-09 NOTE — Telephone Encounter (Signed)
Patient was referred to [place] for Left sided tinnitus - related to recent bells palsy. Does he need to go to Audiologist first or see [place]?

## 2022-11-14 ENCOUNTER — Encounter: Payer: Self-pay | Admitting: Internal Medicine

## 2022-11-14 ENCOUNTER — Other Ambulatory Visit: Payer: Self-pay | Admitting: Internal Medicine

## 2022-11-14 DIAGNOSIS — R070 Pain in throat: Secondary | ICD-10-CM

## 2022-11-14 DIAGNOSIS — H9312 Tinnitus, left ear: Secondary | ICD-10-CM

## 2022-11-14 NOTE — Telephone Encounter (Signed)
He should start with audiology so we have that to review by the time we are able to get him in

## 2022-11-14 NOTE — Telephone Encounter (Signed)
I sent message to referring provider to make them aware

## 2022-12-01 NOTE — Addendum Note (Signed)
Addended by: Pincus Sanes on: 12/01/2022 10:50 AM   Modules accepted: Orders

## 2022-12-04 ENCOUNTER — Encounter: Payer: Self-pay | Admitting: Internal Medicine

## 2022-12-04 NOTE — Progress Notes (Unsigned)
Subjective:    Patient ID: Danny Zimmerman, male    DOB: 11/11/1970, 52 y.o.   MRN: [id number]     HPI Danny Zimmerman is here for follow up of his chronic medical problems.  Anxiety - much better - medication has helped significantly.   No other panic attacks  Can not sleep at night - body is asleep but his mind is awake.  Wakes up 4 times at night to urinate and he typically urinates good amounts- falls back to sleep easily.  Falls asleep initially easily.  He does not feel like he is getting good quality sleep.  He does feel tired throughout the day.  Has sleep apnea eval next month  10/24 - audiology 11/11 - ENT   Still has tinnitus which is intermittent and the ear feels clogged.  Intermittent throat pain.    Medications and allergies reviewed with patient and updated if appropriate.  Current Outpatient Medications on File Prior to Visit  Medication Sig Dispense Refill   Acetaminophen (TYLENOL 8 HOUR PO) Take by mouth as needed.     ibuprofen (ADVIL) 200 MG tablet Take 200 mg by mouth every 6 (six) hours as needed.     No current facility-administered medications on file prior to visit.     Review of Systems     Objective:   Vitals:   12/05/22 0919  BP: 124/82  Pulse: 95  Temp: 98.5 F (36.9 C)  SpO2: 96%   BP Readings from Last 3 Encounters:  12/05/22 124/82  11/07/22 124/80  11/04/22 114/78   Wt Readings from Last 3 Encounters:  12/05/22 187 lb (84.8 kg)  11/07/22 187 lb (84.8 kg)  08/29/22 195 lb (88.5 kg)   Body mass index is 29.29 kg/m.    Physical Exam Constitutional:      General: He is not in acute distress.    Appearance: Normal appearance. He is not ill-appearing.  HENT:     Head: Normocephalic and atraumatic.  Skin:    General: Skin is warm and dry.  Neurological:     Mental Status: He is alert. Mental status is at baseline.  Psychiatric:        Mood and Affect: Mood normal.        Behavior: Behavior normal.        Thought Content:  Thought content normal.        Judgment: Judgment normal.        Lab Results  Component Value Date   WBC 6.5 04/17/2022   HGB 15.9 04/17/2022   HCT 46.1 04/17/2022   PLT 335.0 04/17/2022   GLUCOSE 86 04/17/2022   CHOL 220 (H) 04/17/2022   TRIG (H) 04/17/2022    479.0 Triglyceride is over 400; calculations on Lipids are invalid.   HDL 46.80 04/17/2022   LDLDIRECT 86.0 04/17/2022   LDLCALC 127 (H) 01/30/2018   ALT 34 04/17/2022   AST 25 04/17/2022   NA 137 04/17/2022   K 3.9 04/17/2022   CL 100 04/17/2022   CREATININE 0.76 04/17/2022   BUN 8 04/17/2022   CO2 23 04/17/2022   TSH 1.07 04/17/2022   PSA 0.80 04/17/2022   HGBA1C 5.9 04/17/2022     Assessment & Plan:    See Problem List for Assessment and Plan of chronic medical problems.

## 2022-12-04 NOTE — Patient Instructions (Incomplete)
      Blood work was ordered.   The lab is on the first floor.    Medications changes include :       A referral was ordered and someone will call you to schedule an appointment.     No follow-ups on file.  

## 2022-12-05 ENCOUNTER — Ambulatory Visit (INDEPENDENT_AMBULATORY_CARE_PROVIDER_SITE_OTHER): Payer: 59 | Admitting: Internal Medicine

## 2022-12-05 VITALS — BP 124/82 | HR 95 | Temp 98.5°F | Ht 67.0 in | Wt 187.0 lb

## 2022-12-05 DIAGNOSIS — F419 Anxiety disorder, unspecified: Secondary | ICD-10-CM

## 2022-12-05 MED ORDER — DULOXETINE HCL 30 MG PO CPEP: 30.0000 mg | ORAL_CAPSULE | Freq: Every day | ORAL | 1 refills | Status: DC

## 2022-12-05 NOTE — Assessment & Plan Note (Signed)
Subacute Really started after having Bell's palsy Much improved after starting Cymbalta 30 mg daily 4 weeks ago Denies any further panic attacks Continue for now-discussed this will unlikely be long-term medication, but okay to continue as long as he needs it Encouraged him to restart regular exercise Follow-up in 6 months, sooner if needed

## 2022-12-19 ENCOUNTER — Encounter: Payer: Self-pay | Admitting: Internal Medicine

## 2022-12-20 ENCOUNTER — Ambulatory Visit: Payer: 59 | Admitting: Neurology

## 2022-12-20 ENCOUNTER — Encounter: Payer: Self-pay | Admitting: Neurology

## 2022-12-20 VITALS — BP 131/90 | HR 84 | Ht 65.0 in | Wt 184.4 lb

## 2022-12-20 DIAGNOSIS — R0681 Apnea, not elsewhere classified: Secondary | ICD-10-CM

## 2022-12-20 DIAGNOSIS — G4700 Insomnia, unspecified: Secondary | ICD-10-CM | POA: Diagnosis not present

## 2022-12-20 DIAGNOSIS — G4719 Other hypersomnia: Secondary | ICD-10-CM

## 2022-12-20 DIAGNOSIS — R519 Headache, unspecified: Secondary | ICD-10-CM

## 2022-12-20 DIAGNOSIS — R0683 Snoring: Secondary | ICD-10-CM | POA: Diagnosis not present

## 2022-12-20 DIAGNOSIS — R351 Nocturia: Secondary | ICD-10-CM

## 2022-12-20 DIAGNOSIS — Z9189 Other specified personal risk factors, not elsewhere classified: Secondary | ICD-10-CM

## 2022-12-20 DIAGNOSIS — E663 Overweight: Secondary | ICD-10-CM

## 2022-12-20 NOTE — Patient Instructions (Signed)

## 2022-12-20 NOTE — Progress Notes (Signed)
Subjective:    Patient ID: Danny Zimmerman is a 52 y.o. male.  HPI    Huston Foley, MD, PhD [facility] [address], [address] [address] [place], [place] [address]  Dear Dr. Lawerance Bach,   I saw your patient, Danny Zimmerman, upon your kind request in my sleep clinic today for initial consultation of his sleep disorder, in particular, concern for underlying obstructive sleep apnea.  The patient is unaccompanied today.  As you know, Danny Zimmerman is a 52 year old male with an underlying medical history of Bell's palsy on the left in late April 2024 (for which he saw my colleague Dr. Teresa Coombs), reflux disease, allergies, history of kidney stone, anxiety and overweight state, who reports snoring and excessive daytime somnolence and waking up with a sense of gasping for air.  His Epworth sleepiness score is 6 out of 24, fatigue severity score is 49 out of 63.  He reports that he has had difficulty with sleep since May of this year, essentially after he had his Bell's palsy.  He reports stress, he reports panic attacks.  He tried gabapentin at night which caused him to feel drowsy.  He stopped taking the gabapentin.  He was started on Cymbalta but did not want to take something on a regular basis for anxiety.  He works in a Technical sales engineer as a Engineer, materials.  He lives with his wife and 2 older children, ages 28 and 34.  His youngest child went off to college in [place].  He is a non-smoker.  He does not drink any alcohol currently, essentially since his Bell's palsy.  He drinks limited caffeine in the form of coffee in the morning, usually 1 serving.  He does have a TV in his bedroom and it tends to stay on on a darker screen.  They have 1 dog in the household.  Bedtime is generally around 11 or midnight and rise time at 7:10 AM.  He has had rare headaches in the mornings, he has significant nocturia about 5 or 6 times per average night.  He has woken up with a sense of gasping for air and his wife has noticed  choking sounds in his sleep.  I reviewed your office note from 11/07/2022.  His Past Medical History Is Significant For: Past Medical History:  Diagnosis Date   Allergy    pollen   GERD (gastroesophageal reflux disease)    History of kidney stones    Ureteral stone     His Past Surgical History Is Significant For: Past Surgical History:  Procedure Laterality Date   CYSTOSCOPY/RETROGRADE/URETEROSCOPY/STONE EXTRACTION WITH BASKET Left 02/01/2015   Procedure: CYSTOSCOPY/LEFT RETROGRADE/LEFT URETERAL BALLON DILITATION/LEFT /URETEROSCOPY/STONE EXTRACTION WITH BASKET LEFT URETERAL STENT;  Surgeon: Jethro Bolus, MD;  Location: [facility];  Service: Urology;  Laterality: Left;   UPPER GASTROINTESTINAL ENDOSCOPY  2018    His Family History Is Significant For: Family History  Problem Relation Age of Onset   Hyperlipidemia Mother    Colon cancer Maternal Aunt    Diabetes Paternal Uncle    Esophageal cancer Neg Hx    Stomach cancer Neg Hx    Colon polyps Neg Hx    Rectal cancer Neg Hx    Sleep apnea Neg Hx     His Social History Is Significant For: Social History   Socioeconomic History   Marital status: Married    Spouse name: Not on file   Number of children: 3   Years of education: Not on file  Highest education level: Not on file  Occupational History   Occupation: Financial planner    Comment: [employer]  Tobacco Use   Smoking status: Never   Smokeless tobacco: Never  Vaping Use   Vaping status: Never Used  Substance and Sexual Activity   Alcohol use: Not Currently    Comment: socially   Drug use: No   Sexual activity: Yes    Partners: Female  Other Topics Concern   Not on file  Social History Narrative   Pt lives in 2 story home with his wife and 3 children   High school graduate   Works as Financial planner with [employer]   Social Determinants of Health   Financial Resource Strain: Not on file  Food Insecurity: Not on file  Transportation Needs: No  Transportation Needs (02/11/2019)   PRAPARE - Administrator, Civil Service (Medical): No    Lack of Transportation (Non-Medical): No  Physical Activity: Not on file  Stress: Not on file  Social Connections: Not on file    His Allergies Are:  No Known Allergies:   His Current Medications Are:  Outpatient Encounter Medications as of 12/20/2022  Medication Sig   Acetaminophen (TYLENOL 8 HOUR PO) Take by mouth as needed.   ibuprofen (ADVIL) 200 MG tablet Take 200 mg by mouth as needed.   DULoxetine (CYMBALTA) 30 MG capsule Take 1 capsule (30 mg total) by mouth daily.   No facility-administered encounter medications on file as of 12/20/2022.  :   Review of Systems:  Out of a complete 14 point review of systems, all are reviewed and negative with the exception of these symptoms as listed below:  Review of Systems  Neurological:        Pt here for sleep consult Pt snores, some headaches,fatigue,  Pt denies sleep study,CPAP machine, hypertension Pt states at times wake up choking    ESS FSS      Objective:  Neurological Exam  Physical Exam Physical Examination:   Vitals:   12/20/22 1034  BP: (!) 131/90  Pulse: 84    General Examination: The patient is a very pleasant 52 y.o. male in no acute distress. He appears well-developed and well-nourished and well groomed.   HEENT: Normocephalic, atraumatic, pupils are equal, round and reactive to light, extraocular tracking is good without limitation to gaze excursion or nystagmus noted. Hearing is grossly intact. Face is slightly asymmetric with mild residual left facial weakness noted and very mild Bell's phenomenon upon eye closure initially.  He is able to close his eyes completely quite well.  Speech is clear with no dysarthria noted. There is no hypophonia. There is no lip, neck/head, jaw or voice tremor. Neck is supple with full range of passive and active motion. There are no carotid bruits on auscultation.  Oropharynx exam reveals: mild mouth dryness, good dental hygiene and moderate airway crowding, due to thicker soft palate, larger uvula, small airway entry and tonsillar size of about 2+ bilaterally.  Mallampati class III.  Neck circumference 17-1/4 inches.  Moderate overbite noted.  Tongue protrudes centrally and palate elevates symmetrically.  Chest: Clear to auscultation without wheezing, rhonchi or crackles noted.  Heart: S1+S2+0, regular and normal without murmurs, rubs or gallops noted.   Abdomen: Soft, non-tender and non-distended.  Extremities: There is no pitting edema in the distal lower extremities bilaterally.   Skin: Warm and dry without trophic changes noted.   Musculoskeletal: exam reveals no obvious joint deformities.  Neurologically:  Mental status: The patient is awake, alert and oriented in all 4 spheres. His immediate and remote memory, attention, language skills and fund of knowledge are appropriate. There is no evidence of aphasia, agnosia, apraxia or anomia. Speech is clear with normal prosody and enunciation. Thought process is linear. Mood is normal and affect is normal.  Cranial nerves II - XII are as described above under HEENT exam.  Motor exam: Normal bulk, strength and tone is noted. There is no obvious action or resting tremor.  Fine motor skills and coordination: grossly intact.  Cerebellar testing: No dysmetria or intention tremor. There is no truncal or gait ataxia.  Sensory exam: intact to light touch in the upper and lower extremities.  Gait, station and balance: He stands easily. No veering to one side is noted. No leaning to one side is noted. Posture is age-appropriate and stance is narrow based. Gait shows normal stride length and decreased pace. No problems turning are noted.   Assessment and Plan:  In summary, Danny Zimmerman is a very pleasant 52 y.o.-year old male with an underlying medical history of Bell's palsy on the left in late April 2024 (for  which he saw my colleague Dr. Teresa Coombs), reflux disease, allergies, history of kidney stone, anxiety and overweight state, whose history and physical exam are concerning for sleep disordered breathing, particularly obstructive sleep apnea (OSA). A laboratory attended sleep study is typically considered "gold standard" for evaluation of sleep disordered breathing.   I had a long chat with the patient about my findings and the diagnosis of sleep apnea, particularly OSA, its prognosis and treatment options. We talked about medical/conservative treatments, surgical interventions and non-pharmacological approaches for symptom control. I explained, in particular, the risks and ramifications of untreated moderate to severe OSA, especially with respect to developing cardiovascular disease down the road, including congestive heart failure (CHF), difficult to treat hypertension, cardiac arrhythmias (particularly A-fib), neurovascular complications including TIA, stroke and dementia. Even type 2 diabetes has, in part, been linked to untreated OSA. Symptoms of untreated OSA may include (but may not be limited to) daytime sleepiness, nocturia (i.e. frequent nighttime urination), memory problems, mood irritability and suboptimally controlled or worsening mood disorder such as depression and/or anxiety, lack of energy, lack of motivation, physical discomfort, as well as recurrent headaches, especially morning or nocturnal headaches. We talked about the importance of maintaining a healthy lifestyle and striving for healthy weight.  In addition, we talked about the importance of striving for and maintaining good sleep hygiene. I recommended a sleep study at this time. I outlined the differences between a laboratory attended sleep study which is considered more comprehensive and accurate over the option of a home sleep test (HST); the latter may lead to underestimation of sleep disordered breathing in some instances and does not help  with diagnosing upper airway resistance syndrome and is not accurate enough to diagnose primary central sleep apnea typically. I outlined possible surgical and non-surgical treatment options of OSA, including the use of a positive airway pressure (PAP) device (i.e. CPAP, AutoPAP/APAP or BiPAP in certain circumstances), a custom-made dental device (aka oral appliance, which would require a referral to a specialist dentist or orthodontist typically, and is generally speaking not considered for patients with full dentures or edentulous state), upper airway surgical options, such as traditional UPPP (which is not considered a first-line treatment) or the Inspire device (hypoglossal nerve stimulator, which would involve a referral for consultation with an ENT surgeon, after careful selection, following  inclusion criteria - also not first-line treatment). I explained the PAP treatment option to the patient in detail, as this is generally considered first-line treatment.  The patient indicated that he would be willing to try PAP therapy, if the need arises. I explained the importance of being compliant with PAP treatment, not only for insurance purposes but primarily to improve patient's symptoms symptoms, and for the patient's long term health benefit, including to reduce His cardiovascular risks longer-term.    We will pick up our discussion about the next steps and treatment options after testing.  We will keep him posted as to the test results by phone call and/or MyChart messaging where possible.  We will plan to follow-up in sleep clinic accordingly as well.  I answered all his questions today and the patient was in agreement.   I encouraged him to call with any interim questions, concerns, problems or updates or email [place] through MyChart.  Generally speaking, sleep test authorizations may take up to 2 weeks, sometimes less, sometimes longer, the patient is encouraged to get in touch with [place] if they do not hear  back from the sleep lab staff directly within the next 2 weeks.  Thank you very much for allowing me to participate in the care of this nice patient. If I can be of any further assistance to you please do not hesitate to call me at [phone].  Sincerely,   Huston Foley, MD, PhD

## 2022-12-25 ENCOUNTER — Telehealth: Payer: Self-pay | Admitting: Neurology

## 2022-12-25 NOTE — Telephone Encounter (Signed)
NPSG- [employer] pending uploaded notes.  HST- UHC no auth req.

## 2022-12-27 NOTE — Telephone Encounter (Signed)
[employer] denied the NPSG- HST no auth req for [facility]

## 2023-01-03 ENCOUNTER — Telehealth: Payer: Self-pay | Admitting: Audiologist

## 2023-01-03 ENCOUNTER — Ambulatory Visit: Payer: 59 | Attending: Internal Medicine | Admitting: Audiologist

## 2023-01-03 DIAGNOSIS — H9312 Tinnitus, left ear: Secondary | ICD-10-CM | POA: Diagnosis present
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
## 2023-01-17 ENCOUNTER — Ambulatory Visit (INDEPENDENT_AMBULATORY_CARE_PROVIDER_SITE_OTHER): Payer: 59 | Admitting: Otolaryngology

## 2023-01-17 ENCOUNTER — Encounter (INDEPENDENT_AMBULATORY_CARE_PROVIDER_SITE_OTHER): Payer: Self-pay | Admitting: Otolaryngology

## 2023-01-17 VITALS — BP 122/81 | HR 82 | Ht 65.0 in | Wt 187.0 lb

## 2023-01-17 DIAGNOSIS — J3089 Other allergic rhinitis: Secondary | ICD-10-CM

## 2023-01-17 DIAGNOSIS — R0981 Nasal congestion: Secondary | ICD-10-CM | POA: Diagnosis not present

## 2023-01-17 DIAGNOSIS — H9312 Tinnitus, left ear: Secondary | ICD-10-CM | POA: Diagnosis not present

## 2023-01-17 DIAGNOSIS — G510 Bell's palsy: Secondary | ICD-10-CM | POA: Diagnosis not present

## 2023-01-17 DIAGNOSIS — J343 Hypertrophy of nasal turbinates: Secondary | ICD-10-CM

## 2023-01-17 DIAGNOSIS — H93A2 Pulsatile tinnitus, left ear: Secondary | ICD-10-CM

## 2023-01-17 DIAGNOSIS — H6993 Unspecified Eustachian tube disorder, bilateral: Secondary | ICD-10-CM

## 2023-01-17 DIAGNOSIS — J329 Chronic sinusitis, unspecified: Secondary | ICD-10-CM

## 2023-01-17 DIAGNOSIS — J342 Deviated nasal septum: Secondary | ICD-10-CM

## 2023-01-17 MED ORDER — CETIRIZINE HCL 10 MG PO TABS: 10.0000 mg | ORAL_TABLET | Freq: Every day | ORAL | 11 refills | Status: DC

## 2023-01-17 MED ORDER — FLUTICASONE PROPIONATE 50 MCG/ACT NA SUSP: 2.0000 | Freq: Two times a day (BID) | NASAL | 6 refills | Status: DC

## 2023-01-17 NOTE — Patient Instructions (Addendum)
-   schedule Duplex U/S of the neck - schedule MRI IAC with contrast  - start Zyrtec 10 mg daily and Flonase nasal spray - return after testing   See information about Eustachian Tube Dysfunction below:    Overview The eustachian (say "you-STAY-shee-un") tubes connect the middle ear on each side to the back of the throat. They keep air pressure stable in the ears. If your eustachian tubes become blocked, the air pressure in your ears changes. A quick change in air pressure can cause eustachian tubes to close up. This might happen when an airplane changes altitude or when a scuba diver goes up or down underwater. And a cold can make the tubes swell and block the fluid in the middle ear from draining out. That can cause pain.  Eustachian tube problems often clear up on their own or after treating the cause of the blockage. If your tubes continue to be blocked, you may need surgery.  Follow-up care is a key part of your treatment and safety. Be sure to make and go to all appointments, and call your doctor or nurse advice line (811 in most provinces and territories) if you are having problems. It's also a good idea to know your test results and keep a list of the medicines you take.  How can you care for yourself at home? Try a simple exercise to help open blocked tubes. Close your mouth, hold your nose, and gently blow as if you are blowing your nose. Yawning and chewing gum also may help. You may hear or feel a "pop" when the tubes open. To ease ear pain, apply a warm face cloth or a heating pad set on low. There may be some drainage from the ear when the heat melts earwax. Put a cloth between the heat source and your skin. If your doctor prescribed antibiotics, take them as directed. Do not stop taking them just because you feel better. You need to take the full course of antibiotics. Be safe with medicines. Depending on the cause of the problem, your doctor may recommend over-the-counter medicine.  For example, adults may try decongestants for cold symptoms or nasal spray steroids for allergies. Follow the instructions carefully.

## 2023-01-17 NOTE — Progress Notes (Signed)
ENT CONSULT:  Reason for Consult: chronic left sided tinnitus    HPI: Danny Zimmerman is an 52 y.o. male with hx Bell's palsy in April 2024, with left facial weakness and left tinnitus. He had Audiogram 01/03/23 which showed normal hearing AU. He continues to have left ear "beeping," and at times tinnitus described as a "heart beat." It is always there and is only present on the left side. He reports that certain body position changes can diminish the tinnitus. At times the tinnitus gets really loud. He has nasal congestion and sensation of stuffy nose all the time. He has environmental allergies, usually worse in the spring. He usually self-treats with OTC allergy medication. He gets itchy eyes and sneezing along with nasal congestion. No head trauma, no hx of high blood pressure per report, but was noted to have high BP when seen for Bell's palsy.    Records Reviewed:  Office note by Dr Lawerance Bach, PCP Still has tinnitus from bell's palsy on left side - it is persistent.  When he lays down if he is at a certain angle it will go away, but if he moves it will come back.  This has been very bothersome to him.   Anxiety - he sometimes wakes up and feels like he can not breath and has an anxiety attack.  He does snore.     She does have anxiety attacks or feels anxious during the day as well as in the middle of the night.  This is something that has persisted since the Bell's palsy.  On occasion he will feel tightness in his chest which he feels is related to the anxiety.     4 days ago - cold symptoms. 3 days ago went to urgent care - strep neg, covid neg.  He has sore throat, hoarseness, cough.  This is also made it difficult for him to sleep.  He has taken theraflu.      ED/UC note 11/04/22 Danny Zimmerman is a 52 y.o. male  presents for evaluation of URI symptoms for 1 days. Patient reports associated symptoms of sore throat. Denies N/V/D, cough, congestion, ear pain, body aches, fevers, shortness of  breath. Patient does note that have a hx of asthma or smoking. No known sick contacts.  Pt has taken nothing OTC for symptoms.  In addition patient reports a persistent tinnitus in his left ear since May after being diagnosed with Bell's palsy.  He has seen his PCP regarding this.  He does have an appointment with his PCP on Wednesday as well.  Pt has no other concerns at this time.    ED note 07/09/22  This patient is a very pleasant 52 year old male, he denies any chronic medical problems, he states that he was normal when he woke up this morning, he reports that throughout the day he noticed that he developed a feeling of facial drooping on the left side of his face like something was not quite right.  His significant other saw him and confirmed that this was not right and felt like his speech may have been abnormal.  He notes that he cannot close his left eyelid, he cannot whistle, he is also noticed that food has tasted differently today as well.  He denies any numbness or weakness or changes in vision, he has no difficulty walking or using his hands for coordination or strength.  He denies any difficulty with speech and his spouse at the bedside reports that his speech is normal  at this time.   He was seen at the urgent care where it was thought that he may have something more concerning given the headache and the neurologic symptoms.  The patient reports that he does have a headache for the last few days but it is gradually improving and it is minimal at this time.  Exam:The only neurologic abnormalities that the patient has is that he has a left-sided facial droop, he cannot close his left eye, the left forehead does not raise the same amount as the right forehead.  Food has tasted different, there is no sensory deficits to the face the arms of the legs.  He has normal finger-nose-finger, totally normal strength in all 4 extremities, normal speech and memory and level of alertness.  Gait is totally  normal.   Diagnosed with Bells palsy Past Medical History:  Diagnosis Date   Allergy    pollen   GERD (gastroesophageal reflux disease)    History of kidney stones    Ureteral stone     Past Surgical History:  Procedure Laterality Date   CYSTOSCOPY/RETROGRADE/URETEROSCOPY/STONE EXTRACTION WITH BASKET Left 02/01/2015   Procedure: CYSTOSCOPY/LEFT RETROGRADE/LEFT URETERAL BALLON DILITATION/LEFT /URETEROSCOPY/STONE EXTRACTION WITH BASKET LEFT URETERAL STENT;  Surgeon: Jethro Bolus, MD;  Location: [facility];  Service: Urology;  Laterality: Left;   UPPER GASTROINTESTINAL ENDOSCOPY  2018    Family History  Problem Relation Age of Onset   Hyperlipidemia Mother    Colon cancer Maternal Aunt    Diabetes Paternal Uncle    Esophageal cancer Neg Hx    Stomach cancer Neg Hx    Colon polyps Neg Hx    Rectal cancer Neg Hx    Sleep apnea Neg Hx     Social History:  reports that he has never smoked. He has never used smokeless tobacco. He reports that he does not currently use alcohol. He reports that he does not use drugs.  Allergies: No Known Allergies  Medications: I have reviewed the patient's current medications.  The PMH, PSH, Medications, Allergies, and SH were reviewed and updated.  ROS: Constitutional: Negative for fever, weight loss and weight gain. Cardiovascular: Negative for chest pain and dyspnea on exertion. Respiratory: Is not experiencing shortness of breath at rest. Gastrointestinal: Negative for nausea and vomiting. Neurological: Negative for headaches. Psychiatric: The patient is not nervous/anxious  Blood pressure 122/81, pulse 82, height 5\' 5"  (1.651 m), weight 187 lb (84.8 kg), SpO2 97%.  PHYSICAL EXAM:  Exam: General: Well-developed, well-nourished Respiratory Respiratory effort: Equal inspiration and expiration without stridor Cardiovascular Peripheral Vascular: Warm extremities with equal color/perfusion Eyes: No nystagmus with equal extraocular  motion bilaterally Neuro/Psych/Balance: Patient oriented to person, place, and time; Appropriate mood and affect; Gait is intact with no imbalance; Cranial nerves I-XII are intact Head and Face Inspection: Normocephalic and atraumatic without mass or lesion Palpation: Facial skeleton intact without bony stepoffs Salivary Glands: No mass or tenderness Facial Strength: Facial motility symmetric and full bilaterally ENT Pinna: External ear intact and fully developed External canal: Canal is patent with intact skin Tympanic Membrane: Clear and mobile External Nose: No scar or anatomic deformity Internal Nose: Septum intact and deviated to the left. Mucosal edema and ITH present.  No polyps or purulence Nasopharynx: no masses or lesions patent eustachian tube opening Lips, Teeth, and gums: Mucosa and teeth intact and viable TMJ: No pain to palpation with full mobility Oral cavity/oropharynx: No erythema or exudate, no lesions present Neck Neck and Trachea: Midline trachea without mass or lesion  Thyroid: No mass or nodularity Lymphatics: No lymphadenopathy  Procedure:   PROCEDURE NOTE: nasal endoscopy  Preoperative diagnosis: chronic sinusitis symptoms  Postoperative diagnosis: same  Procedure: Diagnostic nasal endoscopy (69629)  Surgeon: Ashok Croon, M.D.  Anesthesia: Topical lidocaine and Afrin  H&P REVIEW: The patient's history and physical were reviewed today prior to procedure. All medications were reviewed and updated as well. Complications: None Condition is stable throughout exam Indications and consent: The patient presents with symptoms of chronic sinusitis not responding to previous therapies. All the risks, benefits, and potential complications were reviewed with the patient preoperatively and informed consent was obtained. The time out was completed with confirmation of the correct procedure.   Procedure: The patient was seated upright in the clinic. Topical  lidocaine and Afrin were applied to the nasal cavity. After adequate anesthesia had occurred, the rigid nasal endoscope was passed into the nasal cavity. The nasal mucosa, turbinates, septum, and sinus drainage pathways were visualized bilaterally. This revealed no purulence or significant secretions that might be cultured. There were no polyps or sites of significant inflammation. The mucosa was intact and there was no crusting present. The scope was then slowly withdrawn and the patient tolerated the procedure well. There were no complications or blood loss.  Studies Reviewed: CT head 04/13/22 Done for hx of fall FINDINGS: Brain: No acute intracranial hemorrhage, midline shift or mass effect. No extra-axial fluid collection. Gray-white matter differentiation is within normal limits. No hydrocephalus.   Vascular: No hyperdense vessel or unexpected calcification.   Skull: Normal. Negative for fracture or focal lesion.   Sinuses/Orbits: No acute finding.   Other: None.   IMPRESSION: No acute intracranial process.  Audiogram    History: Danny Zimmerman was seen for an audiological evaluation due to ringing in his left ear. The tinnitus started when he had an episode of Bell's Palsy. Danny Zimmerman feels he can hear well, except for when flying. He recently flew while congested and his hearing was muffles after landing. The fullness has since stopped and hearing is restored. The tinnitus is keeping him from sleeping well. Danny Zimmerman takes melatonin, he says he struggled to sleep even before the Bell's Palsy. [place] also has anxiety. He denies any pain in either ear.     Otoscopy showed a clear view of the tympanic membranes, bilaterally Tympanometry results were consistent with normal middle ear function, bilaterally   Audiometric testing was completed using Conventional Audiometry techniques with insert earphones and TDH headphones. Test results are consistent with normal hearing in each ear. Speech Recognition  Thresholds were obtained at 15dB HL in the right ear and at 10dB HL in the left ear. Word Recognition Testing was completed at 40 dB SL and Danny Zimmerman scored 100% in each ear.     Assessment/Plan: Encounter Diagnoses  Name Primary?   Pulsatile tinnitus, left ear Yes   Tinnitus, left    Bell's palsy, left    Chronic nasal congestion    Environmental and seasonal allergies    Nasal septal deviation    Dysfunction of both eustachian tubes    Hypertrophy of both inferior nasal turbinates     52 year old healthy male with a history of Bell's palsy episode on the left side back in April 2024, associated with left-sided tinnitus described as beeping and pulsatile sounds at times.  Normal hearing evaluation with unremarkable audiogram.  Ear exam was also unremarkable today.  He reports nasal congestion and nasal endoscopy demonstrated septal deviation inferior turban hypertrophy and clear secretions but no purulence  or polyps and nasopharynx was clear with patent eustachian tube openings bilaterally.  Left-sided tinnitus sometimes described as pulsatile tinnitus after episode of Bell's palsy 5 months ago, facial palsy resolved at this time.  No evidence of hearing loss on hearing test.  -MRI IAC with and without contrast to evaluate for vascular anomalies -Ultrasound carotid duplex to rule out carotid stenosis - received counseling on coping strategies for tinnitus  2.  Chronic nasal congestion suspected environmental allergies.  Could have an element of eustachian tube dysfunction.  -Nasal endoscopy summarized above - no masses in nasopharynx -Zyrtec 10 mg daily and Flonase 2 puffs bilateral nares twice daily  Return after testing  Thank you for allowing me to participate in the care of this patient. Please do not hesitate to contact me with any questions or concerns.   Ashok Croon, MD Otolaryngology [facility] Phone: [phone] Fax: [phone]    01/17/2023, 11:30 AM

## 2023-01-21 ENCOUNTER — Encounter: Payer: Self-pay | Admitting: Internal Medicine

## 2023-01-21 MED ORDER — DULOXETINE HCL 30 MG PO CPEP: 30.0000 mg | ORAL_CAPSULE | Freq: Every day | ORAL | Status: DC | PRN

## 2023-03-20 ENCOUNTER — Ambulatory Visit (INDEPENDENT_AMBULATORY_CARE_PROVIDER_SITE_OTHER): Payer: 59 | Admitting: Otolaryngology

## 2023-04-15 ENCOUNTER — Encounter: Payer: Self-pay | Admitting: Internal Medicine

## 2023-04-15 MED ORDER — DIAZEPAM 5 MG PO TABS: ORAL_TABLET | ORAL | 0 refills | Status: AC

## 2023-04-17 ENCOUNTER — Ambulatory Visit (HOSPITAL_COMMUNITY): Payer: 59

## 2023-04-18 ENCOUNTER — Ambulatory Visit (HOSPITAL_BASED_OUTPATIENT_CLINIC_OR_DEPARTMENT_OTHER)
Admission: RE | Admit: 2023-04-18 | Discharge: 2023-04-18 | Disposition: A | Discharge status: Home / Self Care | Payer: 59 | Source: Ambulatory Visit | Attending: Otolaryngology | Admitting: Otolaryngology

## 2023-04-18 ENCOUNTER — Ambulatory Visit (HOSPITAL_COMMUNITY)
Admission: RE | Admit: 2023-04-18 | Discharge: 2023-04-18 | Disposition: A | Discharge status: Home / Self Care | Payer: 59 | Source: Ambulatory Visit | Attending: Otolaryngology | Admitting: Otolaryngology

## 2023-04-18 DIAGNOSIS — H93A2 Pulsatile tinnitus, left ear: Secondary | ICD-10-CM

## 2023-04-18 MED ORDER — GADOBUTROL 1 MMOL/ML IV SOLN
8.0000 mL | Freq: Once | INTRAVENOUS | Status: AC | PRN
  Administered 2023-04-18: 8 mL via INTRAVENOUS

## 2023-04-18 NOTE — Progress Notes (Signed)
 Carotid artery duplex has been completed. Preliminary results can be found in CV Proc through chart review.   04/18/23 4:00 PM Birda Buffy RVT

## 2023-05-03 ENCOUNTER — Ambulatory Visit (INDEPENDENT_AMBULATORY_CARE_PROVIDER_SITE_OTHER): Payer: 59 | Admitting: Otolaryngology

## 2023-05-03 ENCOUNTER — Encounter (INDEPENDENT_AMBULATORY_CARE_PROVIDER_SITE_OTHER): Payer: Self-pay | Admitting: Otolaryngology

## 2023-05-03 VITALS — BP 132/83 | HR 74

## 2023-05-03 DIAGNOSIS — H9312 Tinnitus, left ear: Secondary | ICD-10-CM

## 2023-05-03 DIAGNOSIS — J341 Cyst and mucocele of nose and nasal sinus: Secondary | ICD-10-CM | POA: Diagnosis not present

## 2023-05-03 DIAGNOSIS — R0981 Nasal congestion: Secondary | ICD-10-CM

## 2023-05-03 DIAGNOSIS — H93A2 Pulsatile tinnitus, left ear: Secondary | ICD-10-CM

## 2023-05-03 DIAGNOSIS — J3089 Other allergic rhinitis: Secondary | ICD-10-CM

## 2023-05-03 DIAGNOSIS — J342 Deviated nasal septum: Secondary | ICD-10-CM

## 2023-05-03 DIAGNOSIS — J343 Hypertrophy of nasal turbinates: Secondary | ICD-10-CM

## 2023-05-03 MED ORDER — FLUTICASONE PROPIONATE 50 MCG/ACT NA SUSP: 2.0000 | Freq: Two times a day (BID) | NASAL | 6 refills | Status: DC

## 2023-05-03 MED ORDER — CETIRIZINE HCL 10 MG PO TABS: 10.0000 mg | ORAL_TABLET | Freq: Every day | ORAL | 11 refills | Status: DC

## 2023-05-03 NOTE — Progress Notes (Signed)
ENT Progress Note:   Update 05/03/2023  Discussed the use of AI scribe software for clinical note transcription with the patient, who gave verbal consent to proceed.  History of Present Illness   Danny Zimmerman is a 53 year old male who presents with persistent heartbeat sensation and ringing in the ears. Had Duplex and hearing test, here to discuss his results.   He experiences a persistent sensation of his heartbeat and occasional palpitations. The sensation is constant, and the palpitations are primarily on the left side of his neck. An MRI brain/IAC was normal, and a carotid ultrasound showed bilateral stenosis between 1% and 39%. He is concerned about the implications of plaque buildup. MRI showed left maxillary sinus mucus retention cyst and he wanted to know the clinical significance of that.   He reports ringing in the ears, localized to the left side. A hearing test showed normal hearing and word discrimination, except for a slight drop at one frequency.   An MRI incidentally noted a mucous retention cyst in the maxillary sinus on the left, but he has not reported symptoms consistent with chronic sinus disease. Denies frequent sinus infections.      Records Reviewed:  Initial Evaluation  Reason for Consult: chronic left sided tinnitus    HPI: Danny Zimmerman is an 52 y.o. male with hx Bell's palsy in April 2024, with left facial weakness and left tinnitus. He had Audiogram 01/03/23 which showed normal hearing AU. He continues to have left ear "beeping," and at times tinnitus described as a "heart beat." It is always there and is only present on the left side. He reports that certain body position changes can diminish the tinnitus. At times the tinnitus gets really loud. He has nasal congestion and sensation of stuffy nose all the time. He has environmental allergies, usually worse in the spring. He usually self-treats with OTC allergy medication. He gets itchy eyes and sneezing along with  nasal congestion. No head trauma, no hx of high blood pressure per report, but was noted to have high BP when seen for Bell's palsy.    Records Reviewed:  Office note by Dr Lawerance Bach, PCP Still has tinnitus from bell's palsy on left side - it is persistent.  When he lays down if he is at a certain angle it will go away, but if he moves it will come back.  This has been very bothersome to him.   Anxiety - he sometimes wakes up and feels like he can not breath and has an anxiety attack.  He does snore.     She does have anxiety attacks or feels anxious during the day as well as in the middle of the night.  This is something that has persisted since the Bell's palsy.  On occasion he will feel tightness in his chest which he feels is related to the anxiety.     4 days ago - cold symptoms. 3 days ago went to urgent care - strep neg, covid neg.  He has sore throat, hoarseness, cough.  This is also made it difficult for him to sleep.  He has taken theraflu.      ED/UC note 11/04/22 [name] is a 53 y.o. male  presents for evaluation of URI symptoms for 1 days. Patient reports associated symptoms of sore throat. Denies N/V/D, cough, congestion, ear pain, body aches, fevers, shortness of breath. Patient does note that have a hx of asthma or smoking. No known sick contacts.  Pt has taken  nothing OTC for symptoms.  In addition patient reports a persistent tinnitus in his left ear since May after being diagnosed with Bell's palsy.  He has seen his PCP regarding this.  He does have an appointment with his PCP on Wednesday as well.  Pt has no other concerns at this time.    ED note 07/09/22  This patient is a very pleasant 53 year old male, he denies any chronic medical problems, he states that he was normal when he woke up this morning, he reports that throughout the day he noticed that he developed a feeling of facial drooping on the left side of his face like something was not quite right.  His significant  other saw him and confirmed that this was not right and felt like his speech may have been abnormal.  He notes that he cannot close his left eyelid, he cannot whistle, he is also noticed that food has tasted differently today as well.  He denies any numbness or weakness or changes in vision, he has no difficulty walking or using his hands for coordination or strength.  He denies any difficulty with speech and his spouse at the bedside reports that his speech is normal at this time.   He was seen at the urgent care where it was thought that he may have something more concerning given the headache and the neurologic symptoms.  The patient reports that he does have a headache for the last few days but it is gradually improving and it is minimal at this time.  Exam:The only neurologic abnormalities that the patient has is that he has a left-sided facial droop, he cannot close his left eye, the left forehead does not raise the same amount as the right forehead.  Food has tasted different, there is no sensory deficits to the face the arms of the legs.  He has normal finger-nose-finger, totally normal strength in all 4 extremities, normal speech and memory and level of alertness.  Gait is totally normal.   Diagnosed with Bells palsy Past Medical History:  Diagnosis Date   Allergy    pollen   GERD (gastroesophageal reflux disease)    History of kidney stones    Ureteral stone     Past Surgical History:  Procedure Laterality Date   CYSTOSCOPY/RETROGRADE/URETEROSCOPY/STONE EXTRACTION WITH BASKET Left 02/01/2015   Procedure: CYSTOSCOPY/LEFT RETROGRADE/LEFT URETERAL BALLON DILITATION/LEFT /URETEROSCOPY/STONE EXTRACTION WITH BASKET LEFT URETERAL STENT;  Surgeon: Jethro Bolus, MD;  Location: [facility];  Service: Urology;  Laterality: Left;   UPPER GASTROINTESTINAL ENDOSCOPY  2018    Family History  Problem Relation Age of Onset   Hyperlipidemia Mother    Colon cancer Maternal Aunt    Diabetes  Paternal Uncle    Esophageal cancer Neg Hx    Stomach cancer Neg Hx    Colon polyps Neg Hx    Rectal cancer Neg Hx    Sleep apnea Neg Hx     Social History:  reports that he has never smoked. He has never used smokeless tobacco. He reports that he does not currently use alcohol. He reports that he does not use drugs.  Allergies: No Known Allergies  Medications: I have reviewed the patient's current medications.  The PMH, PSH, Medications, Allergies, and SH were reviewed and updated.  ROS: Constitutional: Negative for fever, weight loss and weight gain. Cardiovascular: Negative for chest pain and dyspnea on exertion. Respiratory: Is not experiencing shortness of breath at rest. Gastrointestinal: Negative for nausea and vomiting. Neurological: Negative for  headaches. Psychiatric: The patient is not nervous/anxious  Blood pressure 132/83, pulse 74, SpO2 98%.  PHYSICAL EXAM:  Exam: General: Well-developed, well-nourished Respiratory Respiratory effort: Equal inspiration and expiration without stridor Cardiovascular Peripheral Vascular: Warm extremities with equal color/perfusion Eyes: No nystagmus with equal extraocular motion bilaterally Neuro/Psych/Balance: Patient oriented to person, place, and time; Appropriate mood and affect; Gait is intact with no imbalance; Cranial nerves I-XII are intact Head and Face Inspection: Normocephalic and atraumatic without mass or lesion Facial Strength: Facial motility symmetric and full bilaterally ENT Pinna: External ear intact and fully developed External canal: Canal is patent with intact skin Tympanic Membrane: Clear and mobile External Nose: No scar or anatomic deformity Lips, Teeth, and gums: Mucosa and teeth intact and viable TMJ: No pain to palpation with full mobility Oral cavity/oropharynx: No erythema or exudate, no lesions present Neck Neck and Trachea: Midline trachea without mass or lesion  Studies Reviewed: CT head  04/13/22 Done for hx of fall FINDINGS: Brain: No acute intracranial hemorrhage, midline shift or mass effect. No extra-axial fluid collection. Gray-white matter differentiation is within normal limits. No hydrocephalus.   Vascular: No hyperdense vessel or unexpected calcification.   Skull: Normal. Negative for fracture or focal lesion.   Sinuses/Orbits: No acute finding.   Other: None.   IMPRESSION: No acute intracranial process.  Audiogram    History: Jamen was seen for an audiological evaluation due to ringing in his left ear. The tinnitus started when he had an episode of Bell's Palsy. Rodman feels he can hear well, except for when flying. He recently flew while congested and his hearing was muffles after landing. The fullness has since stopped and hearing is restored. The tinnitus is keeping him from sleeping well. Yaasir takes melatonin, he says he struggled to sleep even before the Bell's Palsy. Marico also has anxiety. He denies any pain in either ear.     Otoscopy showed a clear view of the tympanic membranes, bilaterally Tympanometry results were consistent with normal middle ear function, bilaterally   Audiometric testing was completed using Conventional Audiometry techniques with insert earphones and TDH headphones. Test results are consistent with normal hearing in each ear. Speech Recognition Thresholds were obtained at 15dB HL in the right ear and at 10dB HL in the left ear. Word Recognition Testing was completed at 40 dB SL and Jadarrius scored 100% in each ear.      MRI IAC 04/18/2023 IMPRESSION: 1. Normal MRI of the brain and internal auditory canals. No abnormal enhancement. 2. Trace fluid in the bilateral mastoid air cells.    Carotid Duplex 04/18/23 Right Carotid: Velocities in the right ICA are consistent with a 1-39%  stenosis.   Left Carotid: Velocities in the left ICA are consistent with a 1-39%  stenosis.   Vertebrals: Bilateral vertebral arteries demonstrate  antegrade    Assessment/Plan: Encounter Diagnoses  Name Primary?   Tinnitus, left Yes   Chronic nasal congestion    Environmental and seasonal allergies    Nasal septal deviation    Hypertrophy of both inferior nasal turbinates      53 year old healthy male with a history of Bell's palsy episode on the left side back in April 2024, associated with left-sided tinnitus described as beeping and pulsatile sounds at times.  Normal hearing evaluation with unremarkable audiogram.  Ear exam was also unremarkable today.  He reports nasal congestion and nasal endoscopy demonstrated septal deviation inferior turban hypertrophy and clear secretions but no purulence or polyps and nasopharynx was clear  with patent eustachian tube openings bilaterally.  Left-sided tinnitus sometimes described as pulsatile tinnitus after episode of Bell's palsy 5 months ago, facial palsy resolved at this time.  No evidence of hearing loss on hearing test.  -MRI IAC with and without contrast to evaluate for vascular anomalies -Ultrasound carotid duplex to rule out carotid stenosis - received counseling on coping strategies for tinnitus  2.  Chronic nasal congestion suspected environmental allergies.  Could have an element of eustachian tube dysfunction.  -Nasal endoscopy summarized above - no masses in nasopharynx -Zyrtec 10 mg daily and Flonase 2 puffs bilateral nares twice daily  Return after testing  Update 05/03/2023 Assessment and Plan    Pulsatile Tinnitus/sound of heartbeat in left ear Had normal nasal endoscopy and no evidence of masses or lesions near left eustachian tube on nasal endoscopy during his initial office visit a few months ago. Hearing test with overall normal hearing (slight drop at a single frequency).   Reports constant heartbeat sound and occasional palpitations. Normal brain MRI and hearing test results rule out ear-related causes. Carotid ultrasound shows bilateral stenosis (1-39%). Symptoms  may be related to cardiovascular issues. I encouraged him to schedule a follow-up with primary care or internist for additional workup and evaluation.  - Refer to primary care for cardiovascular evaluation of his sx   Maxillary Sinus Mucous Retention Cyst MRI incidentally found a mucous retention cyst in the left maxillary sinus. Reports occasional pressure around the ear. No hx of chronic sinusitis. Recommended trial of Zyrtec and Flonase to alleviate nasal congestion and improve sinus drainage. No surgical interventions indicated at this time - Trial of Zyrtec and Flonase - Refill nasal spray prescription, Zyrtec previously prescribed  Follow-up - Send note to Dr. Eileen Stanford - Follow up as needed.      Ashok Croon, MD Otolaryngology [facility] Phone: [phone] Fax: [phone]    05/03/2023, 12:11 PM

## 2023-05-07 DIAGNOSIS — E781 Pure hyperglyceridemia: Secondary | ICD-10-CM | POA: Insufficient documentation

## 2023-05-07 NOTE — Patient Instructions (Addendum)
 Blood work was ordered.       Medications changes include :   pepcid 40 mg daily for 2-3 weeks and then as needed     Return in about 6 months (around 11/05/2023) for Physical Exam.    GERD in Adults: What to Know  Gastroesophageal reflux (GER) is when acid from your stomach flows up into your esophagus. Your esophagus is the part of your body that moves food from your mouth to your stomach. Normally, food goes down and stays in your stomach to be digested. But with GER, food and stomach acid may go back up. You may have a disease called gastroesophageal reflux disease (GERD) if the reflux: Happens often. Causes very bad symptoms. Makes your esophagus sore and swollen. Over time, GERD can make small holes called ulcers in the lining of your esophagus. What are the causes? GERD is caused by a problem with the muscle between your esophagus and stomach. This muscle is called the lower esophageal sphincter (LES). When it's weak or not normal, it doesn't close like it should. This means food and stomach acid can go back up into your esophagus. The muscle can be weak if: You smoke or use products with tobacco in them. You're pregnant. You have a type of hernia called a hiatal hernia. You eat certain foods and drinks. These include: Alcohol. Coffee. Chocolate. Onions. Peppermint. What increases the risk? Being overweight. Having a disease that affects your connective tissue. Taking NSAIDs, such as ibuprofen. What are the signs or symptoms? Heartburn. Trouble swallowing. Pain when you swallow. The feeling of having a lump in your throat. A bitter taste in your mouth. Bad breath. Having an upset or bloated stomach. Burping. Chest pain. Other conditions can also cause chest pain. Make sure you see your health care provider if you have chest pain. Wheezing. This is when you make high-pitched whistling sounds when you breathe, most often when you breathe out. A long-term  cough or a cough at night. How is this diagnosed? GERD may be diagnosed based on your medical history and a physical exam. You may also have tests. These may include: An endoscopy. This test looks at your stomach and esophagus with a small camera. A barium swallow test. This shows the shape and size of your esophagus and how well it's working. Tests of your esophagus to check for: Acid levels. Pressure. How is this treated? Treatment may depend on how bad your symptoms are. It may include: Changes to your diet and daily life. Medicines. Surgery. Follow these instructions at home: Eating and drinking Follow an eating plan as told by your provider. You may need to avoid certain foods and drinks. These may include: Coffee and tea, with or without caffeine. Alcohol. Energy drinks and sports drinks. Fizzy drinks or sodas. Chocolate and cocoa. Peppermint and mint flavorings. Garlic and onions. Horseradish. Spicy and acidic foods. These include: Peppers. Chili powder and curry powder. Vinegar. Hot sauces and BBQ sauce. Citrus fruits and juices. These include: Oranges. Lemons. Limes. Tomato-based foods. These include: Red sauce and pizza with red sauce. Chili. Salsa. Fried and fatty foods. These include: Donuts. [place] fries. Potato chips. High-fat dressings. High-fat meats. These include: Hot dogs and sausage. Rib eye steak. Ham and bacon. High-fat dairy items. These include: Whole milk. Butter. Cream cheese. Eat small meals often. Avoid eating big meals. Avoid drinking lots of liquid with your meals. Try not to eat meals during the 2-3 hours before bedtime. Try  not to lie down right after you eat. Do not exercise right after you eat. Lifestyle  If you're overweight, lose an amount of weight that's healthy for you. Ask your provider about a safe weight loss goal. Do not smoke, vape, or use nicotine or tobacco. Wear loose clothes. Do not wear things that are tight  around your waist. When you sleep, try: Raising the head of your bed about 6 inches (15 cm). You can use a wedge to do this. Lying down on your left side. Try to lower your stress. If you need help doing this, ask your provider. General instructions Take your medicines only as told. Do not take aspirin or ibuprofen unless you're told to. Watch for any changes in your symptoms. Do not bend over if it makes your symptoms worse. Contact a health care provider if: You have new symptoms. You have trouble: Drinking. Swallowing. Eating. It hurts to swallow. You have wheezing. You have a cough that won't go away. Your voice is hoarse. Your symptoms don't get better with treatment. Get help right away if: You have pain all of a sudden in your: Arm. Neck. Jaw. Teeth. Back. You feel sweaty, dizzy, or light-headed all of a sudden. You faint. You have chest pain or shortness of breath. You vomit and the vomit is: Green, yellow, or black. Looks like blood or coffee grounds. Your poop is red, bloody, or black. These symptoms may be an emergency. Call 911 right away. Do not wait to see if the symptoms will go away. Do not drive yourself to the hospital. This information is not intended to replace advice given to you by your health care provider. Make sure you discuss any questions you have with your health care provider. Document Revised: 01/08/2023 Document Reviewed: 07/25/2022 Elsevier Patient Education  2024 [employer].

## 2023-05-07 NOTE — Assessment & Plan Note (Addendum)
 Subacute Started after Bells palsy Likely nerve damage Saw ENT - MRI head, ENT exam w/o cause - advised considering cardiovascular cause

## 2023-05-07 NOTE — Progress Notes (Unsigned)
 Subjective:    Patient ID: Danny Zimmerman, male    DOB: 07/01/1970, 53 y.o.   MRN: [id number]     HPI Danny Zimmerman is here for follow up of his chronic medical problems.  Persistent pulsatile tinnitus - ? MRA brain.  Saw ENT - MR brain neg.  ENT eval neg.     Left chest pain - At times has pain in left chest.  At times if sitting down and is in a position and moves a certain way he will have the pain - if he moves around the pain will go away.     Burping a lot. Has reflux.  Has reflux after eating most day. He feels like he gets full pretty fast.  He will have bloating. Has had symptoms for a while.  if he eats sweet, chocolate he feels like he can not swallow - he will pause or drink water and it goes down.   Walking for exercise.   Eating pretty good.      Medications and allergies reviewed with patient and updated if appropriate.  Current Outpatient Medications on File Prior to Visit  Medication Sig Dispense Refill   Acetaminophen (TYLENOL 8 HOUR PO) Take by mouth as needed.     fluticasone (FLONASE) 50 MCG/ACT nasal spray Place 2 sprays into both nostrils 2 (two) times daily. 16 g 6   No current facility-administered medications on file prior to visit.     Review of Systems  Constitutional:  Negative for fever.  HENT:  Positive for congestion (mild - chronic) and tinnitus.   Respiratory:  Negative for cough, shortness of breath and wheezing.   Cardiovascular:  Positive for chest pain (left sides - msk). Negative for palpitations and leg swelling.  Gastrointestinal:  Negative for abdominal pain and nausea.  Neurological:  Positive for dizziness (occ), light-headedness (occ) and headaches (occ - normal headahces).       Objective:   Vitals:   05/08/23 0840  BP: 128/76  Pulse: 74  Temp: 98.1 F (36.7 C)  SpO2: 97%   BP Readings from Last 3 Encounters:  05/08/23 128/76  05/03/23 132/83  01/17/23 122/81   Wt Readings from Last 3 Encounters:  05/08/23 185 lb 9.6  oz (84.2 kg)  01/17/23 187 lb (84.8 kg)  12/20/22 184 lb 6.4 oz (83.6 kg)   Body mass index is 30.89 kg/m.    Physical Exam Constitutional:      General: He is not in acute distress.    Appearance: Normal appearance. He is not ill-appearing.  HENT:     Head: Normocephalic and atraumatic.  Eyes:     Conjunctiva/sclera: Conjunctivae normal.  Neck:     Vascular: No carotid bruit.  Cardiovascular:     Rate and Rhythm: Normal rate and regular rhythm.     Heart sounds: Normal heart sounds.  Pulmonary:     Effort: Pulmonary effort is normal. No respiratory distress.     Breath sounds: Normal breath sounds. No wheezing or rales.  Musculoskeletal:     Cervical back: Neck supple.     Right lower leg: No edema.     Left lower leg: No edema.  Lymphadenopathy:     Cervical: No cervical adenopathy.  Skin:    General: Skin is warm and dry.     Findings: No rash.  Neurological:     Mental Status: He is alert. Mental status is at baseline.  Psychiatric:  Mood and Affect: Mood normal.        Lab Results  Component Value Date   WBC 6.5 04/17/2022   HGB 15.9 04/17/2022   HCT 46.1 04/17/2022   PLT 335.0 04/17/2022   GLUCOSE 86 04/17/2022   CHOL 220 (H) 04/17/2022   TRIG (H) 04/17/2022    479.0 Triglyceride is over 400; calculations on Lipids are invalid.   HDL 46.80 04/17/2022   LDLDIRECT 86.0 04/17/2022   LDLCALC 127 (H) 01/30/2018   ALT 34 04/17/2022   AST 25 04/17/2022   NA 137 04/17/2022   K 3.9 04/17/2022   CL 100 04/17/2022   CREATININE 0.76 04/17/2022   BUN 8 04/17/2022   CO2 23 04/17/2022   TSH 1.07 04/17/2022   PSA 0.80 04/17/2022   HGBA1C 5.9 04/17/2022     Assessment & Plan:    See Problem List for Assessment and Plan of chronic medical problems.

## 2023-05-08 ENCOUNTER — Encounter: Payer: Self-pay | Admitting: Internal Medicine

## 2023-05-08 ENCOUNTER — Ambulatory Visit: Payer: 59 | Admitting: Internal Medicine

## 2023-05-08 VITALS — BP 128/76 | HR 74 | Temp 98.1°F | Wt 185.6 lb

## 2023-05-08 DIAGNOSIS — E781 Pure hyperglyceridemia: Secondary | ICD-10-CM | POA: Diagnosis not present

## 2023-05-08 DIAGNOSIS — F419 Anxiety disorder, unspecified: Secondary | ICD-10-CM

## 2023-05-08 DIAGNOSIS — I6523 Occlusion and stenosis of bilateral carotid arteries: Secondary | ICD-10-CM | POA: Insufficient documentation

## 2023-05-08 DIAGNOSIS — R7303 Prediabetes: Secondary | ICD-10-CM

## 2023-05-08 DIAGNOSIS — H9312 Tinnitus, left ear: Secondary | ICD-10-CM

## 2023-05-08 DIAGNOSIS — K219 Gastro-esophageal reflux disease without esophagitis: Secondary | ICD-10-CM | POA: Insufficient documentation

## 2023-05-08 LAB — COMPREHENSIVE METABOLIC PANEL
ALT: 27 U/L (ref 0–53)
AST: 22 U/L (ref 0–37)
Albumin: 4.6 g/dL (ref 3.5–5.2)
Alkaline Phosphatase: 82 U/L (ref 39–117)
BUN: 11 mg/dL (ref 6–23)
CO2: 28 meq/L (ref 19–32)
Calcium: 9.3 mg/dL (ref 8.4–10.5)
Chloride: 101 meq/L (ref 96–112)
Creatinine, Ser: 0.88 mg/dL (ref 0.40–1.50)
GFR: 100.46 mL/min (ref >60.00)
Glucose, Bld: 97 mg/dL (ref 70–99)
Potassium: 3.9 meq/L (ref 3.5–5.1)
Sodium: 137 meq/L (ref 135–145)
Total Bilirubin: 1.2 mg/dL (ref 0.2–1.2)
Total Protein: 8.2 g/dL (ref 6.0–8.3)

## 2023-05-08 LAB — HEMOGLOBIN A1C: Hgb A1c MFr Bld: 6 % (ref 4.6–6.5)

## 2023-05-08 LAB — LIPID PANEL
Cholesterol: 219 mg/dL — ABNORMAL HIGH (ref 0–200)
HDL: 50.5 mg/dL (ref >39.00)
LDL Cholesterol: 117 mg/dL — ABNORMAL HIGH (ref 0–99)
NonHDL: 168.56
Total CHOL/HDL Ratio: 4
Triglycerides: 259 mg/dL — ABNORMAL HIGH (ref 0.0–149.0)
VLDL: 51.8 mg/dL — ABNORMAL HIGH (ref 0.0–40.0)

## 2023-05-08 MED ORDER — FAMOTIDINE 40 MG PO TABS: 40.0000 mg | ORAL_TABLET | Freq: Every day | ORAL | 1 refills | Status: DC

## 2023-05-08 NOTE — Assessment & Plan Note (Signed)
 Chronic Lab Results  Component Value Date   HGBA1C 6.0 05/08/2023    Check a1c Low sugar / carb diet Stressed regular exercise

## 2023-05-08 NOTE — Assessment & Plan Note (Signed)
 New Recent ultrasound of carotid arteries had mild bilateral stenosis Stressed lifestyle changes-improve diet, regular exercise, weight loss Check lipid panel May need to consider a statin Discussed CT CAC-will discuss again in the fall

## 2023-05-08 NOTE — Assessment & Plan Note (Signed)
 New Symptoms consistent with GERD Start Pepcid 40 mg daily for 2-3 weeks and then try to stop and use as needed as long as GERD is controlled Revise diet Weight loss Information given about GERD

## 2023-05-08 NOTE — Assessment & Plan Note (Signed)
 Chronic Stressed diet changes Continue and increase regular exercise Work on weight loss Lipid panel today

## 2023-05-08 NOTE — Assessment & Plan Note (Signed)
 Chronic Better No longer taking cymbalta

## 2023-05-09 ENCOUNTER — Encounter: Payer: Self-pay | Admitting: Internal Medicine

## 2023-05-13 ENCOUNTER — Encounter: Payer: Self-pay | Admitting: Internal Medicine

## 2023-05-13 DIAGNOSIS — H93A2 Pulsatile tinnitus, left ear: Secondary | ICD-10-CM

## 2023-05-17 ENCOUNTER — Telehealth: Payer: Self-pay

## 2023-05-17 NOTE — Telephone Encounter (Signed)
 Copied from CRM [id number]. Topic: Clinical - Request for Lab/Test Order >> May 17, 2023 10:39 AM Denese Killings wrote: Reason for CRM: Synethia with [facility] is calling to let ckinic know that patient needs to be authorized by 10:00 a.m. on 03/11. If not patient will have to reschedule his MRI of the head on 03/12.

## 2023-05-19 MED ORDER — DIAZEPAM 5 MG PO TABS: ORAL_TABLET | ORAL | 0 refills | Status: AC

## 2023-05-19 NOTE — Addendum Note (Signed)
 Addended by: Pincus Sanes on: 05/19/2023 11:05 AM   Modules accepted: Orders

## 2023-05-21 ENCOUNTER — Ambulatory Visit
Admission: RE | Admit: 2023-05-21 | Discharge: 2023-05-21 | Disposition: A | Discharge status: Home / Self Care | Source: Ambulatory Visit | Attending: Internal Medicine

## 2023-05-21 DIAGNOSIS — H93A2 Pulsatile tinnitus, left ear: Secondary | ICD-10-CM

## 2023-05-22 ENCOUNTER — Other Ambulatory Visit

## 2023-05-30 ENCOUNTER — Encounter: Payer: Self-pay | Admitting: Internal Medicine

## 2023-06-05 ENCOUNTER — Ambulatory Visit: Payer: 59 | Admitting: Internal Medicine

## 2023-06-08 ENCOUNTER — Ambulatory Visit
Admission: EM | Admit: 2023-06-08 | Discharge: 2023-06-08 | Disposition: A | Discharge status: Home / Self Care | Attending: Family Medicine | Admitting: Family Medicine

## 2023-06-08 DIAGNOSIS — R42 Dizziness and giddiness: Secondary | ICD-10-CM

## 2023-06-08 DIAGNOSIS — R0789 Other chest pain: Secondary | ICD-10-CM | POA: Diagnosis not present

## 2023-06-08 LAB — POCT FASTING CBG KUC MANUAL ENTRY: POCT Glucose (KUC): 104 mg/dL — AB (ref 70–99)

## 2023-06-08 MED ORDER — CYCLOBENZAPRINE HCL 5 MG PO TABS: 5.0000 mg | ORAL_TABLET | Freq: Every evening | ORAL | 0 refills | Status: DC | PRN

## 2023-06-08 MED ORDER — IBUPROFEN 600 MG PO TABS: 600.0000 mg | ORAL_TABLET | Freq: Four times a day (QID) | ORAL | 0 refills | Status: DC | PRN

## 2023-06-08 NOTE — ED Triage Notes (Signed)
 Pt A/O-steady gait to tx area-pt c/o feeling light headed and sweating-pt's wife states they were at breakfast ~1 hour PTA-pt "got pale, went blank, burping, chest discomfort" x 10-15 mins-felt better after walking outside-pt states he felt spacey not himself-pt states hx of chest discomfort x 2-3 months and was seen by PCP for c/o-pt states he now feels "a little bit better just feel a little jittery, nervous"-NAD

## 2023-06-08 NOTE — ED Provider Notes (Signed)
 [facility]  Note:  This document was prepared using Conservation officer, historic buildings and may include unintentional dictation errors.  MRN: [id number] DOB: 1970-12-20  Subjective:   Danny Zimmerman is a 53 y.o. male presenting for 2 to 62-month history of persistent intermittent midsternal chest pain.  Has also had episodes of feeling lightheaded, dizzy, getting pale, having mental fog, tinnitus.  Patient has concerns that ever since he had Bell's palsy of the left side his health has deteriorated.  Has had extensive workup with his PCP.  Reports that they have not found a source for his symptoms.  Patient does have a history of acid reflux, takes Pepcid as needed.  Does not take diabetes medication, is not hypertensive.  Does not have dyslipidemia.  No history of MI, heart disease.  No smoking.  No current facility-administered medications for this encounter.  Current Outpatient Medications:    Acetaminophen (TYLENOL 8 HOUR PO), Take by mouth as needed., Disp: , Rfl:    famotidine (PEPCID) 40 MG tablet, Take 1 tablet (40 mg total) by mouth daily. Take daily for 2-3 weeks then take prn, Disp: 30 tablet, Rfl: 1   fluticasone (FLONASE) 50 MCG/ACT nasal spray, Place 2 sprays into both nostrils 2 (two) times daily., Disp: 16 g, Rfl: 6   No Known Allergies  Past Medical History:  Diagnosis Date   Allergy    pollen   GERD (gastroesophageal reflux disease)    History of kidney stones    Ureteral stone      Past Surgical History:  Procedure Laterality Date   CYSTOSCOPY/RETROGRADE/URETEROSCOPY/STONE EXTRACTION WITH BASKET Left 02/01/2015   Procedure: CYSTOSCOPY/LEFT RETROGRADE/LEFT URETERAL BALLON DILITATION/LEFT /URETEROSCOPY/STONE EXTRACTION WITH BASKET LEFT URETERAL STENT;  Surgeon: Jethro Bolus, MD;  Location: [facility];  Service: Urology;  Laterality: Left;   UPPER GASTROINTESTINAL ENDOSCOPY  2018    Family History  Problem Relation Age of Onset    Hyperlipidemia Mother    Colon cancer Maternal Aunt    Diabetes Paternal Uncle    Esophageal cancer Neg Hx    Stomach cancer Neg Hx    Colon polyps Neg Hx    Rectal cancer Neg Hx    Sleep apnea Neg Hx     Social History   Tobacco Use   Smoking status: Never   Smokeless tobacco: Never  Vaping Use   Vaping status: Never Used  Substance Use Topics   Alcohol use: Not Currently   Drug use: No    ROS   Objective:   Vitals: BP 123/81 (BP Location: Left Arm)   Pulse 66   Temp 98.4 F (36.9 C) (Oral)   Resp 20   SpO2 96%   Physical Exam Constitutional:      General: He is not in acute distress.    Appearance: Normal appearance. He is well-developed and normal weight. He is not ill-appearing, toxic-appearing or diaphoretic.  HENT:     Head: Normocephalic and atraumatic.     Right Ear: Tympanic membrane, ear canal and external ear normal. There is no impacted cerumen.     Left Ear: Tympanic membrane, ear canal and external ear normal. There is no impacted cerumen.     Nose: Nose normal.     Mouth/Throat:     Mouth: Mucous membranes are moist.     Pharynx: Oropharynx is clear.  Eyes:     General: No scleral icterus.       Right eye: No discharge.  Left eye: No discharge.     Extraocular Movements: Extraocular movements intact.  Neck:     Meningeal: Brudzinski's sign and Kernig's sign absent.  Cardiovascular:     Rate and Rhythm: Normal rate and regular rhythm.     Heart sounds: Normal heart sounds. No murmur heard.    No friction rub. No gallop.  Pulmonary:     Effort: Pulmonary effort is normal. No respiratory distress.     Breath sounds: Normal breath sounds. No stridor. No wheezing, rhonchi or rales.  Musculoskeletal:     Cervical back: Normal range of motion.  Neurological:     Mental Status: He is alert and oriented to person, place, and time.     Cranial Nerves: No cranial nerve deficit, dysarthria or facial asymmetry.     Motor: No weakness, abnormal  muscle tone or pronator drift.     Coordination: Romberg sign negative. Coordination normal. Finger-Nose-Finger Test and Heel to [facility] Test normal. Rapid alternating movements normal.     Gait: Gait normal.     Deep Tendon Reflexes: Reflexes normal.  Psychiatric:        Mood and Affect: Mood normal.        Behavior: Behavior normal.        Thought Content: Thought content normal.        Judgment: Judgment normal.     ED ECG REPORT   Date: 06/08/2023  EKG Time: 2:23 PM  Rate: 66 bpm  Rhythm: normal sinus rhythm,  normal EKG, normal sinus rhythm, unchanged from previous tracings  Axis: Normal  Intervals:none  ST&T Change: None  Narrative Interpretation: Sinus rhythm at 66 bpm.  No acute findings.  No changes.   Results for orders placed or performed during the hospital encounter of 06/08/23 (from the past 24 hours)  POCT CBG (manual entry)     Status: Abnormal   Collection Time: 06/08/23  1:51 PM  Result Value Ref Range   POCT Glucose (K[facility]) 104 (A) 70 - 99 mg/dL   Recent Results (from the past 2160 hours)  Lipid panel     Status: Abnormal   Collection Time: 05/08/23  9:23 AM  Result Value Ref Range   Cholesterol 219 (H) 0 - 200 mg/dL    Comment: ATP III Classification       Desirable:  < 200 mg/dL               Borderline High:  200 - 239 mg/dL          High:  > = 657 mg/dL   Triglycerides 846.9 (H) 0.0 - 149.0 mg/dL    Comment: Normal:  <629 mg/dLBorderline High:  150 - 199 mg/dL   HDL 52.84 >13.24 mg/dL   VLDL 40.1 (H) 0.0 - 02.7 mg/dL   LDL Cholesterol 253 (H) 0 - 99 mg/dL   Total CHOL/HDL Ratio 4     Comment:                Men          Women1/2 Average Risk     3.4          3.3Average Risk          5.0          4.42X Average Risk          9.6          7.13X Average Risk          15.0  11.0                       NonHDL 168.56     Comment: NOTE:  Non-HDL goal should be 30 mg/dL higher than patient's LDL goal (i.e. LDL goal of < 70 mg/dL, would have non-HDL goal of <  100 mg/dL)  Hemoglobin Z6X     Status: None   Collection Time: 05/08/23  9:23 AM  Result Value Ref Range   Hgb A1c MFr Bld 6.0 4.6 - 6.5 %    Comment: Glycemic Control Guidelines for People with Diabetes:Non Diabetic:  <6%Goal of Therapy: <7%Additional Action Suggested:  >8%   Comprehensive metabolic panel     Status: None   Collection Time: 05/08/23  9:23 AM  Result Value Ref Range   Sodium 137 135 - 145 mEq/L   Potassium 3.9 3.5 - 5.1 mEq/L   Chloride 101 96 - 112 mEq/L   CO2 28 19 - 32 mEq/L   Glucose, Bld 97 70 - 99 mg/dL   BUN 11 6 - 23 mg/dL   Creatinine, Ser 0.96 0.40 - 1.50 mg/dL   Total Bilirubin 1.2 0.2 - 1.2 mg/dL   Alkaline Phosphatase 82 39 - 117 U/L   AST 22 0 - 37 U/L   ALT 27 0 - 53 U/L   Total Protein 8.2 6.0 - 8.3 g/dL   Albumin 4.6 3.5 - 5.2 g/dL   GFR 045.40 >98.11 mL/min    Comment: Calculated using the CKD-EPI Creatinine Equation (2021)   Calcium 9.3 8.4 - 10.5 mg/dL  POCT CBG (manual entry)     Status: Abnormal   Collection Time: 06/08/23  1:51 PM  Result Value Ref Range   POCT Glucose (KUC) 104 (A) 70 - 99 mg/dL    Assessment and Plan :   PDMP not reviewed this encounter.  1. Atypical chest pain   2. Dizziness    Offered extending the workup to include a TSH, CBC.  Emphasized need for follow-up with his PCP.  No signs of an acute cardiopulmonary process, an acute encephalopathy.  Recommended conservative management, supportive care.  Maintain current medication.  Use ibuprofen, muscle relaxant otherwise.  Consider management for anxiety with his PCP should his testing continue to be normal.  Counseled patient on potential for adverse effects with medications prescribed/recommended today, ER and return-to-clinic precautions discussed, patient verbalized understanding.    Wallis Bamberg, [place] 06/08/23 1428

## 2023-06-09 LAB — CBC WITH DIFFERENTIAL/PLATELET
Basophils Absolute: 0 10*3/uL (ref 0.0–0.2)
Basos: 1 %
EOS (ABSOLUTE): 0.1 10*3/uL (ref 0.0–0.4)
Eos: 1 %
Hematocrit: 49.4 % (ref 37.5–51.0)
Hemoglobin: 16.4 g/dL (ref 13.0–17.7)
Immature Grans (Abs): 0 10*3/uL (ref 0.0–0.1)
Immature Granulocytes: 0 %
Lymphocytes Absolute: 2.3 10*3/uL (ref 0.7–3.1)
Lymphs: 40 %
MCH: 28.7 pg (ref 26.6–33.0)
MCHC: 33.2 g/dL (ref 31.5–35.7)
MCV: 87 fL (ref 79–97)
Monocytes Absolute: 0.5 10*3/uL (ref 0.1–0.9)
Monocytes: 8 %
Neutrophils Absolute: 2.8 10*3/uL (ref 1.4–7.0)
Neutrophils: 50 %
Platelets: 343 10*3/uL (ref 150–450)
RBC: 5.71 x10E6/uL (ref 4.14–5.80)
RDW: 13.1 % (ref 11.6–15.4)
WBC: 5.6 10*3/uL (ref 3.4–10.8)

## 2023-06-09 LAB — TSH: TSH: 0.831 u[IU]/mL (ref 0.450–4.500)

## 2023-06-10 ENCOUNTER — Encounter: Payer: Self-pay | Admitting: Internal Medicine

## 2023-06-20 ENCOUNTER — Encounter: Payer: Self-pay | Admitting: Internal Medicine

## 2023-06-22 MED ORDER — PANTOPRAZOLE SODIUM 40 MG PO TBEC: 40.0000 mg | DELAYED_RELEASE_TABLET | Freq: Every day | ORAL | 5 refills | Status: DC

## 2023-08-21 ENCOUNTER — Ambulatory Visit: Payer: BC Managed Care – PPO | Admitting: Neurology

## 2023-08-21 ENCOUNTER — Encounter: Payer: Self-pay | Admitting: Neurology

## 2023-08-21 VITALS — BP 120/78 | HR 79 | Resp 15 | Ht 65.0 in | Wt 184.0 lb

## 2023-08-21 DIAGNOSIS — R0683 Snoring: Secondary | ICD-10-CM

## 2023-08-21 DIAGNOSIS — Z9189 Other specified personal risk factors, not elsewhere classified: Secondary | ICD-10-CM

## 2023-08-21 DIAGNOSIS — G510 Bell's palsy: Secondary | ICD-10-CM | POA: Diagnosis not present

## 2023-08-21 NOTE — Progress Notes (Signed)
 [facility]  PATIENT: Danny Zimmerman DOB: Sep 30, 1970  REQUESTING CLINICIAN: Colene Dauphin, MD HISTORY FROM: Patient and chart review  REASON FOR VISIT: Left side Bell's Palsy follow up   HISTORICAL  CHIEF COMPLAINT:  Chief Complaint  Patient presents with   BELLS PALSY    Rm12, alone, BELLS PALSY: pt stated that he is doing much better and the bell's palsy is almost resolved, but; the tinnitus and ear pressure in right side remains. Ha's sporadically. Ha's occurred approxiamately 15/30 days. Pt mentioned has occasional spacing out moments.    INTERVAL HISTORY 08/21/2023:  Patient presents today for follow-up, he is alone.  Last visit was a year ago, since then he saw Dr. Omar Bibber for sleep apnea evaluation, was recommended a home sleep study but he has not completed the test. In terms of his Bell's palsy, patient has almost complete recovery of the left facial paralysis.  He still complains of occasional tinnitus and morning headaches. He saw an audiologist and was told that every thing is normal.  His headaches last about 1 to 2 hours, relieved with Tylenol  or ibuprofen .  Tells me that he is working with therapist, taking medication for his anxiety and his symptoms are getting better.  No other complaints, no other concerns.   HISTORY OF PRESENT ILLNESS:  This is a 53 year old gentleman with past medical history of kidney stone, anxiety who is presenting after being recently diagnosed with left-sided Bell's palsy.  Patient reports waking up the morning of April 29 with left facial weakness, he presented to urgent care, diagnosed with Bell's palsy but he was referred to the ED to rule out stroke.  In the ED physical exam consistent with upper and lower facial weakness consistent with Bell's palsy.  He was started on prednisone  and Valacyclovir .  Patient completed treatment with some improvement of the left-sided weakness but is not fully resolved. He reports pain when laying  down on the left side of face, lots of ringing in the left ear.   He is also reporting lately is having difficulty with sleep, some anxiety, feet and hands feeling warm, waking up in the middle of the night with his heart racing.  He reports undergoing a lot of stress lately as his father-in-law just passed away, wife is in [place]  at the moment for the funeral and her daughter just graduated high school and is set to go to college in [place].   OTHER MEDICAL CONDITIONS: Anxiety    REVIEW OF SYSTEMS: Full 14 system review of systems performed and negative with exception of: As noted in the HPI   ALLERGIES: No Known Allergies  HOME MEDICATIONS: Outpatient Medications Prior to Visit  Medication Sig Dispense Refill   Acetaminophen  (TYLENOL  8 HOUR PO) Take by mouth as needed.     fluticasone  (FLONASE ) 50 MCG/ACT nasal spray Place 2 sprays into both nostrils 2 (two) times daily. 16 g 6   ibuprofen  (ADVIL ) 600 MG tablet Take 1 tablet (600 mg total) by mouth every 6 (six) hours as needed. 30 tablet 0   cyclobenzaprine  (FLEXERIL ) 5 MG tablet Take 1 tablet (5 mg total) by mouth at bedtime as needed. 30 tablet 0   famotidine  (PEPCID ) 40 MG tablet Take 1 tablet (40 mg total) by mouth daily. Take daily for 2-3 weeks then take prn 30 tablet 1   pantoprazole  (PROTONIX ) 40 MG tablet Take 1 tablet (40 mg total) by mouth daily. Take 30 minutes prior to a meal. 30 tablet  5   No facility-administered medications prior to visit.    PAST MEDICAL HISTORY: Past Medical History:  Diagnosis Date   Allergy    pollen   GERD (gastroesophageal reflux disease)    History of kidney stones    Ureteral stone     PAST SURGICAL HISTORY: Past Surgical History:  Procedure Laterality Date   CYSTOSCOPY/RETROGRADE/URETEROSCOPY/STONE EXTRACTION WITH BASKET Left 02/01/2015   Procedure: CYSTOSCOPY/LEFT RETROGRADE/LEFT URETERAL BALLON DILITATION/LEFT /URETEROSCOPY/STONE EXTRACTION WITH BASKET LEFT URETERAL STENT;   Surgeon: Annamarie Kid, MD;  Location: [facility];  Service: Urology;  Laterality: Left;   UPPER GASTROINTESTINAL ENDOSCOPY  2018    FAMILY HISTORY: Family History  Problem Relation Age of Onset   Hyperlipidemia Mother    Colon cancer Maternal Aunt    Diabetes Paternal Uncle    Esophageal cancer Neg Hx    Stomach cancer Neg Hx    Colon polyps Neg Hx    Rectal cancer Neg Hx    Sleep apnea Neg Hx     SOCIAL HISTORY: Social History   Socioeconomic History   Marital status: Married    Spouse name: Not on file   Number of children: 3   Years of education: Not on file   Highest education level: Not on file  Occupational History   Occupation: Financial planner    Comment: [employer]  Tobacco Use   Smoking status: Never   Smokeless tobacco: Never  Vaping Use   Vaping status: Never Used  Substance and Sexual Activity   Alcohol use: Not Currently   Drug use: No   Sexual activity: Yes    Partners: Female  Other Topics Concern   Not on file  Social History Narrative   Pt lives in 2 story home with his wife and 3 children   High school graduate   Works as Financial planner with [employer]   Social Drivers of Health   Financial Resource Strain: Not on file  Food Insecurity: Not on file  Transportation Needs: No Transportation Needs (02/11/2019)   PRAPARE - Administrator, Civil Service (Medical): No    Lack of Transportation (Non-Medical): No  Physical Activity: Not on file  Stress: Not on file  Social Connections: Not on file  Intimate Partner Violence: Not on file    PHYSICAL EXAM  GENERAL EXAM/CONSTITUTIONAL: Vitals:  Vitals:   08/21/23 0853  BP: 120/78  Pulse: 79  Resp: 15  SpO2: 93%  Weight: 184 lb (83.5 kg)  Height: 5' 5 (1.651 m)   Body mass index is 30.62 kg/m. Wt Readings from Last 3 Encounters:  08/21/23 184 lb (83.5 kg)  05/08/23 185 lb 9.6 oz (84.2 kg)  01/17/23 187 lb (84.8 kg)   Patient is in no distress; well developed,  nourished and groomed; neck is supple  MUSCULOSKELETAL: Gait, strength, tone, movements noted in Neurologic exam below  NEUROLOGIC: MENTAL STATUS:      No data to display         awake, alert, oriented to person, place and time recent and remote memory intact normal attention and concentration language fluent, comprehension intact, naming intact fund of knowledge appropriate  CRANIAL NERVE:  2nd, 3rd, 4th, 6th - Visual fields full to confrontation, extraocular muscles intact, no nystagmus 5th - facial sensation symmetric 7th - There is very mild decrease left labial fold that is symmetric on activation but otherwise full strength in the facial muscle groups 8th - hearing intact 9th - palate elevates symmetrically, uvula midline  11th - shoulder shrug symmetric 12th - tongue protrusion midline  MOTOR:  normal bulk and tone, full strength in the BUE, BLE  SENSORY:  normal and symmetric to light touch  COORDINATION:  fine finger movements normal  GAIT/STATION:  normal   DIAGNOSTIC DATA (LABS, IMAGING, TESTING) - I reviewed patient records, labs, notes, testing and imaging myself where available.  Lab Results  Component Value Date   WBC 5.6 06/08/2023   HGB 16.4 06/08/2023   HCT 49.4 06/08/2023   MCV 87 06/08/2023   PLT 343 06/08/2023      Component Value Date/Time   NA 137 05/08/2023 0923   K 3.9 05/08/2023 0923   CL 101 05/08/2023 0923   CO2 28 05/08/2023 0923   GLUCOSE 97 05/08/2023 0923   BUN 11 05/08/2023 0923   CREATININE 0.88 05/08/2023 0923   CALCIUM 9.3 05/08/2023 0923   PROT 8.2 05/08/2023 0923   ALBUMIN 4.6 05/08/2023 0923   AST 22 05/08/2023 0923   ALT 27 05/08/2023 0923   ALKPHOS 82 05/08/2023 0923   BILITOT 1.2 05/08/2023 0923   GFRNONAA >60 04/06/2017 1717   GFRAA >60 04/06/2017 1717   Lab Results  Component Value Date   CHOL 219 (H) 05/08/2023   HDL 50.50 05/08/2023   LDLCALC 117 (H) 05/08/2023   LDLDIRECT 86.0 04/17/2022   TRIG  259.0 (H) 05/08/2023   CHOLHDL 4 05/08/2023   Lab Results  Component Value Date   HGBA1C 6.0 05/08/2023   Lab Results  Component Value Date   VITAMINB12 379 04/12/2017   Lab Results  Component Value Date   TSH 0.831 06/08/2023     ASSESSMENT AND PLAN  53 y.o. year old male with history of kidney stone, anxiety who is presenting for follow-up for his left-sided Bell's palsy.  He has almost complete recovery of the Bell's palsy, no further workup or medication indicated at the moment. For his snoring, gasping for air, possible sleep/sleep apnea, I have strongly advised patient to proceed with a home sleep test.  He voiced understanding.  Continue to follow with PCP and return as needed.   1. Bell's palsy   2. Snoring   3. At risk for obstructive sleep apnea      Patient Instructions  Follow-up with Dr. Omar Bibber for a home sleep study Continue follow-up with your PCP Return as needed.  No orders of the defined types were placed in this encounter.   No orders of the defined types were placed in this encounter.   Return if symptoms worsen or fail to improve.  I personally spent a total of 30 minutes in the care of the patient today including preparing to see the patient, getting/reviewing separately obtained history, performing a medically appropriate exam/evaluation, counseling and educating, referring and communicating with other health care professionals, and documenting clinical information in the EHR.   Cassandra Cleveland, MD 08/21/2023, 9:34 AM  [facility] [address], [address] [place], [place] [address] [phone]

## 2023-08-21 NOTE — Patient Instructions (Signed)
 Follow-up with Dr. Omar Bibber for a home sleep study Continue follow-up with your PCP Return as needed.

## 2023-08-22 ENCOUNTER — Encounter: Payer: Self-pay | Admitting: Neurology

## 2023-09-03 ENCOUNTER — Ambulatory Visit (INDEPENDENT_AMBULATORY_CARE_PROVIDER_SITE_OTHER): Admitting: Neurology

## 2023-09-03 DIAGNOSIS — G4710 Hypersomnia, unspecified: Secondary | ICD-10-CM

## 2023-09-03 DIAGNOSIS — R0681 Apnea, not elsewhere classified: Secondary | ICD-10-CM

## 2023-09-03 DIAGNOSIS — R0683 Snoring: Secondary | ICD-10-CM

## 2023-09-03 DIAGNOSIS — G4719 Other hypersomnia: Secondary | ICD-10-CM

## 2023-09-03 DIAGNOSIS — Z9189 Other specified personal risk factors, not elsewhere classified: Secondary | ICD-10-CM

## 2023-09-03 DIAGNOSIS — G4700 Insomnia, unspecified: Secondary | ICD-10-CM

## 2023-09-03 DIAGNOSIS — R351 Nocturia: Secondary | ICD-10-CM

## 2023-09-03 DIAGNOSIS — E663 Overweight: Secondary | ICD-10-CM

## 2023-09-03 DIAGNOSIS — R519 Headache, unspecified: Secondary | ICD-10-CM

## 2023-09-04 ENCOUNTER — Ambulatory Visit: Payer: Self-pay | Admitting: Neurology

## 2023-09-04 NOTE — Progress Notes (Signed)
 See procedure note.

## 2023-09-04 NOTE — Procedures (Signed)
   [facility]  HOME SLEEP TEST (Watch PAT) REPORT  STUDY DATE: 09/03/23  DOB: 08-20-1970  MRN: [id number]  ORDERING CLINICIAN: True Mar, MD, PhD   REFERRING CLINICIAN: Geofm Glade PARAS, MD   CLINICAL INFORMATION/HISTORY: 53 year old male with an underlying medical history of Bell's palsy, reflux disease, allergies, history of kidney stone, anxiety and borderline obesity, who reports snoring and excessive daytime somnolence and waking up with a sense of gasping for air.   Epworth sleepiness score: 6/24.  BMI: 30.6 kg/m  FINDINGS:   Sleep Summary:   Total Recording Time (hours, min): 8 hours, 22 min  Total Sleep Time (hours, min):  7 hours, 6 min  Percent REM (%):    19.6%   Respiratory Indices:   Calculated pAHI (per hour):  4.5/hour         REM pAHI:    5.0/hour       NREM pAHI: 4.4/hour  Central pAHI: 0/hour  Oxygen Saturation Statistics:    Oxygen Saturation (%) Mean: 95%   Minimum oxygen saturation (%):                 90%   O2 Saturation Range (%): 79 - 99%    O2 Saturation (minutes) <=88%: 0 min  Pulse Rate Statistics:   Pulse Mean (bpm):    61/min    Pulse Range (48 - 96/min)   IMPRESSION: Primary snoring   RECOMMENDATION:  This home sleep test does not demonstrate any significant obstructive or central sleep disordered breathing with a total AHI of less than 5/hour. His total AHI was 4.5/hour, O2 nadir of 90% (79% on the technical report looks erroneous).  Snoring was detected, in the mild to moderate range. Treatment with a positive airway pressure device such as AutoPap or CPAP is not indicated based on this test. Snoring may improve with avoidance of the supine sleep position and weight loss (where clinically appropriate).   For disturbing snoring, an oral appliance through dentistry or orthodontics can be considered.  Other causes of the patient's symptoms, including circadian rhythm disturbances, an underlying mood  disorder, medication effect and/or an underlying medical problem cannot be ruled out based on this test. Clinical correlation is recommended.  The patient should be cautioned not to drive, work at heights, or operate dangerous or heavy equipment when tired or sleepy. Review and reiteration of good sleep hygiene measures should be pursued with any patient. The patient will be advised to follow up with his referring provider, who will be notified of the test results.   I certify that I have reviewed the raw data recording prior to the issuance of this report in accordance with the standards of the American Academy of Sleep Medicine (AASM).    INTERPRETING PHYSICIAN:   True Mar, MD, PhD Medical Director, [facility]) Diplomat, ABPN (Neurology and Sleep)   [facility] [address] [place], [place] [address] [phone]

## 2023-09-08 NOTE — Progress Notes (Unsigned)
 Subjective:    Patient ID: Danny Zimmerman, male    DOB: 12/29/1970, 53 y.o.   MRN: [id number]     HPI [name] is here for follow up of his chronic medical problems.  Left sided chest pain - constant x months.  Pain intensity varies.  Nothing makes it worse.  He has applied Vicks vapor rub - ?  Maybe helped a little.   Motrin  has not helped.    Walking for exercise.  Trying to watch the sugars.    Medications and allergies reviewed with patient and updated if appropriate.  Current Outpatient Medications on File Prior to Visit  Medication Sig Dispense Refill   Acetaminophen  (TYLENOL  8 HOUR PO) Take by mouth as needed.     fluticasone  (FLONASE ) 50 MCG/ACT nasal spray Place 2 sprays into both nostrils 2 (two) times daily. 16 g 6   ibuprofen  (ADVIL ) 600 MG tablet Take 1 tablet (600 mg total) by mouth every 6 (six) hours as needed. 30 tablet 0   No current facility-administered medications on file prior to visit.     Review of Systems  Constitutional:  Negative for fever.  HENT:  Positive for tinnitus (left).   Respiratory:  Positive for shortness of breath (at times - with rest or work). Negative for cough and wheezing.   Cardiovascular:  Positive for chest pain (left sided x months). Negative for palpitations.       Objective:   Vitals:   09/09/23 0747  BP: 118/76  Pulse: 60  Temp: 97.9 F (36.6 C)  SpO2: 96%   BP Readings from Last 3 Encounters:  09/09/23 118/76  08/21/23 120/78  06/08/23 123/81   Wt Readings from Last 3 Encounters:  09/09/23 184 lb (83.5 kg)  08/21/23 184 lb (83.5 kg)  05/08/23 185 lb 9.6 oz (84.2 kg)   Body mass index is 30.62 kg/m.    Physical Exam Constitutional:      General: He is not in acute distress.    Appearance: Normal appearance. He is not ill-appearing.  HENT:     Head: Normocephalic and atraumatic.   Eyes:     Conjunctiva/sclera: Conjunctivae normal.    Cardiovascular:     Rate and Rhythm: Normal rate and regular  rhythm.     Heart sounds: Normal heart sounds.  Pulmonary:     Effort: Pulmonary effort is normal. No respiratory distress.     Breath sounds: Normal breath sounds. No wheezing or rales.  Chest:     Chest wall: Tenderness (Tenderness along left sternal border with palpation) present.   Musculoskeletal:     Right lower leg: No edema.     Left lower leg: No edema.   Skin:    General: Skin is warm and dry.     Findings: No rash.   Neurological:     Mental Status: He is alert. Mental status is at baseline.   Psychiatric:        Mood and Affect: Mood normal.        Lab Results  Component Value Date   WBC 5.6 06/08/2023   HGB 16.4 06/08/2023   HCT 49.4 06/08/2023   PLT 343 06/08/2023   GLUCOSE 97 05/08/2023   CHOL 219 (H) 05/08/2023   TRIG 259.0 (H) 05/08/2023   HDL 50.50 05/08/2023   LDLDIRECT 86.0 04/17/2022   LDLCALC 117 (H) 05/08/2023   ALT 27 05/08/2023   AST 22 05/08/2023   NA 137 05/08/2023   K 3.9  05/08/2023   CL 101 05/08/2023   CREATININE 0.88 05/08/2023   BUN 11 05/08/2023   CO2 28 05/08/2023   TSH 0.831 06/08/2023   PSA 0.80 04/17/2022   HGBA1C 6.0 05/08/2023     Assessment & Plan:    See Problem List for Assessment and Plan of chronic medical problems.

## 2023-09-08 NOTE — Patient Instructions (Incomplete)
 Blood work was ordered.   Xrays today.    Medications changes include :   None      Return in about 6 months (around 03/10/2024) for Physical Exam.    Costochondritis  Costochondritis is inflammation of the tissue (cartilage) that connects the ribs to the breastbone (sternum). This causes pain in the front of the chest. The pain often starts slowly and involves more than one rib. What are the causes? This condition results from stress on the cartilage where your ribs attach to your sternum. The exact cause of this stress is not always known. The cause may be: Chest injury. Exercise or activity. This may include lifting. Severe coughing. What increases the risk? You are more likely to develop this condition if: You are male. You are 4-97 years old. You just started a new exercise or work activity. You have low levels of vitamin D. You have a condition that makes you cough often. What are the signs or symptoms? The main symptom of this condition is chest pain. The pain: Starts slowly and can be sharp or dull. Gets worse with deep breathing, coughing, or exercise. Gets better with rest. May be worse when you press on the affected area of your ribs and sternum. How is this diagnosed? This condition is diagnosed based on your symptoms, your medical history, and a physical exam. Your health care provider will check for pain when pressing on your sternum. You may also have tests to rule out other causes of chest pain. These may include: A chest X-ray. This may be done to check for lung problems. An electrocardiogram (ECG). This may be done to see if you have a heart problem that could be causing the pain. An imaging scan. This may be done to rule out a broken bone (fracture) in your chest or ribcage. How is this treated? This condition may go away on its own over time. Your health care provider may prescribe an NSAID, such as ibuprofen , to reduce pain and inflammation.  Treatment may also include: Resting and avoiding activities that make pain worse. Putting heat or ice on the area to reduce pain and inflammation. Doing exercises to stretch your chest muscles. If these treatments do not help, your health care provider may inject a numbing medicine at the spot where the sternum and rib connect. This can help relieve the pain. Follow these instructions at home: Managing pain, stiffness, and swelling     If directed, put ice on the painful area. To do this: Put ice in a plastic bag. Place a towel between your skin and the bag. Leave the ice on for 20 minutes, 2-3 times a day. If directed, apply heat to the affected area as often as told by your health care provider. Use the heat source that your health care provider recommends, such as a moist heat pack or a heating pad. Place a towel between your skin and the heat source. Leave the heat on for 20-30 minutes. If your skin turns bright red, remove the heat or ice right away to prevent skin damage. The risk of skin damage is higher if you cannot feel pain, heat, or cold. Activity Rest as told by your health care provider. Avoid activities that make pain worse. This includes activities that use the muscles in your chest, abdomen, and sides. You may have to avoid lifting. Ask your health care provider how much you can safely lift. Return to your normal activities as told  by your health care provider. Ask your health care provider what activities are safe for you. General instructions Take over-the-counter and prescription medicines only as told by your health care provider. Contact a health care provider if: You have chills or a fever. Your pain does not go away, or it gets worse. You have a cough that does not go away. Get help right away if: You feel short of breath. You have severe chest pain that does not get better with medicines, heat, or ice. These symptoms may be an emergency. Get help right away.  Call 911. Do not wait to see if the symptoms will go away. Do not drive yourself to the hospital. This information is not intended to replace advice given to you by your health care provider. Make sure you discuss any questions you have with your health care provider. Document Revised: 09/14/2021 Document Reviewed: 09/14/2021 Elsevier Patient Education  2024 [employer].

## 2023-09-09 ENCOUNTER — Other Ambulatory Visit (HOSPITAL_COMMUNITY): Payer: Self-pay

## 2023-09-09 ENCOUNTER — Ambulatory Visit (INDEPENDENT_AMBULATORY_CARE_PROVIDER_SITE_OTHER)

## 2023-09-09 ENCOUNTER — Telehealth: Payer: Self-pay

## 2023-09-09 ENCOUNTER — Ambulatory Visit: Payer: Self-pay | Admitting: Internal Medicine

## 2023-09-09 ENCOUNTER — Encounter: Payer: Self-pay | Admitting: Internal Medicine

## 2023-09-09 ENCOUNTER — Ambulatory Visit (INDEPENDENT_AMBULATORY_CARE_PROVIDER_SITE_OTHER): Admitting: Internal Medicine

## 2023-09-09 VITALS — BP 118/76 | HR 60 | Temp 97.9°F | Ht 65.0 in | Wt 184.0 lb

## 2023-09-09 DIAGNOSIS — E781 Pure hyperglyceridemia: Secondary | ICD-10-CM | POA: Diagnosis not present

## 2023-09-09 DIAGNOSIS — R0789 Other chest pain: Secondary | ICD-10-CM | POA: Insufficient documentation

## 2023-09-09 DIAGNOSIS — R7303 Prediabetes: Secondary | ICD-10-CM

## 2023-09-09 DIAGNOSIS — J3089 Other allergic rhinitis: Secondary | ICD-10-CM | POA: Diagnosis not present

## 2023-09-09 DIAGNOSIS — J309 Allergic rhinitis, unspecified: Secondary | ICD-10-CM | POA: Insufficient documentation

## 2023-09-09 LAB — LIPID PANEL
Cholesterol: 224 mg/dL — ABNORMAL HIGH (ref 0–200)
HDL: 32.6 mg/dL — ABNORMAL LOW (ref >39.00)
NonHDL: 190.93
Total CHOL/HDL Ratio: 7
Triglycerides: 1135 mg/dL — ABNORMAL HIGH (ref 0.0–149.0)
VLDL: 227 mg/dL — ABNORMAL HIGH (ref 0.0–40.0)

## 2023-09-09 LAB — COMPREHENSIVE METABOLIC PANEL WITH GFR
ALT: 25 U/L (ref 0–53)
AST: 21 U/L (ref 0–37)
Albumin: 4.7 g/dL (ref 3.5–5.2)
Alkaline Phosphatase: 80 U/L (ref 39–117)
BUN: 12 mg/dL (ref 6–23)
CO2: 25 meq/L (ref 19–32)
Calcium: 9.6 mg/dL (ref 8.4–10.5)
Chloride: 101 meq/L (ref 96–112)
Creatinine, Ser: 0.62 mg/dL (ref 0.40–1.50)
GFR: 111.4 mL/min (ref >60.00)
Glucose, Bld: 99 mg/dL (ref 70–99)
Potassium: 3.8 meq/L (ref 3.5–5.1)
Sodium: 137 meq/L (ref 135–145)
Total Bilirubin: 0.5 mg/dL (ref 0.2–1.2)
Total Protein: 7.9 g/dL (ref 6.0–8.3)

## 2023-09-09 LAB — LDL CHOLESTEROL, DIRECT: Direct LDL: 69 mg/dL

## 2023-09-09 LAB — HEMOGLOBIN A1C: Hgb A1c MFr Bld: 6 % (ref 4.6–6.5)

## 2023-09-09 MED ORDER — ICOSAPENT ETHYL 1 G PO CAPS
2.0000 g | ORAL_CAPSULE | Freq: Two times a day (BID) | ORAL | 5 refills | Status: DC
Start: 2023-09-09 — End: 2023-09-10

## 2023-09-09 MED ORDER — FLUTICASONE PROPIONATE 50 MCG/ACT NA SUSP: 2.0000 | Freq: Two times a day (BID) | NASAL | 6 refills | Status: AC

## 2023-09-09 NOTE — Telephone Encounter (Signed)
 Pharmacy Patient Advocate Encounter   Received notification from Onbase that prior authorization for Vascepa 1 g capsule is required/requested.   Insurance verification completed.   The patient is insured through [facility] .   Per test claim:  Crestor, Lipitor, Zocor, Lovaza or Tricor is preferred by the insurance.  If suggested medication is appropriate, Please send in a new RX and discontinue this one. If not, please advise as to why it's not appropriate so that we may request a Prior Authorization. Please note, some preferred medications may still require a PA.  If the suggested medications have not been trialed and there are no contraindications to their use, the PA will not be submitted, as it will not be approved.

## 2023-09-09 NOTE — Assessment & Plan Note (Signed)
 Chronic Controled Continue flonase  nasal spray

## 2023-09-09 NOTE — Assessment & Plan Note (Addendum)
 Subacute Started months ago - constant Not worse with activity No concerning symptoms Likely costochondritis Discussed topical medications he could use, can try ice, heat We will get chest x-ray, left rib x-ray today

## 2023-09-09 NOTE — Telephone Encounter (Signed)
-----   Message from True Mar sent at 09/04/2023  5:29 PM EDT ----- See MyChart message to patient, FYI.  ----- Message ----- From: Mar True, MD Sent: 09/04/2023   5:28 PM EDT To: True Mar, MD

## 2023-09-09 NOTE — Assessment & Plan Note (Signed)
 Chronic Stressed diet changes Continue and increase regular exercise Work on weight loss Lipid panel today

## 2023-09-09 NOTE — Assessment & Plan Note (Signed)
 Chronic Lab Results  Component Value Date   HGBA1C 6.0 05/08/2023    Check a1c Low sugar / carb diet Stressed regular exercise

## 2023-09-09 NOTE — Telephone Encounter (Signed)
 Results of sleep study were given to pt via mychart message and is noted that pt read these.  09-04-2023.

## 2023-09-10 ENCOUNTER — Telehealth: Payer: Self-pay | Admitting: Internal Medicine

## 2023-09-10 ENCOUNTER — Other Ambulatory Visit: Payer: Self-pay | Admitting: Internal Medicine

## 2023-09-10 DIAGNOSIS — R7303 Prediabetes: Secondary | ICD-10-CM

## 2023-09-10 DIAGNOSIS — E781 Pure hyperglyceridemia: Secondary | ICD-10-CM

## 2023-09-10 DIAGNOSIS — E66811 Obesity, class 1: Secondary | ICD-10-CM | POA: Insufficient documentation

## 2023-09-10 MED ORDER — OMEGA-3-ACID ETHYL ESTERS 1 G PO CAPS: 2.0000 g | ORAL_CAPSULE | Freq: Two times a day (BID) | ORAL | 5 refills | Status: DC

## 2023-09-10 NOTE — Telephone Encounter (Signed)
 Copied from CRM [id number]. Topic: Clinical - Medication Question >> Sep 10, 2023  9:18 AM Zenovia PARAS wrote: Reason for CRM: Patient calling in to speak with Tobias about new medication(do not know the name of it) Would like a call to discuss. If no answer on mobile phone please call work phone [phone]

## 2023-09-10 NOTE — Telephone Encounter (Signed)
 Left message for patient today and mychart sent

## 2023-09-10 NOTE — Telephone Encounter (Signed)
 New prescription sent for triglycerides.  Already ordered lipid profile and he should have this done 6 weeks after taking the medication

## 2023-11-05 ENCOUNTER — Encounter: Payer: Self-pay | Admitting: Internal Medicine

## 2023-11-05 NOTE — Patient Instructions (Addendum)
 Blood work was ordered.       Medications changes include :   pepcid  40 mg daily for 2-3 weeks - then take as needed     Return in about 6 months (around 05/08/2024) for follow up.    Health Maintenance, Male Adopting a healthy lifestyle and getting preventive care are important in promoting health and wellness. Ask your health care provider about: The right schedule for you to have regular tests and exams. Things you can do on your own to prevent diseases and keep yourself healthy. What should I know about diet, weight, and exercise? Eat a healthy diet  Eat a diet that includes plenty of vegetables, fruits, low-fat dairy products, and lean protein. Do not eat a lot of foods that are high in solid fats, added sugars, or sodium. Maintain a healthy weight Body mass index (BMI) is a measurement that can be used to identify possible weight problems. It estimates body fat based on height and weight. Your health care provider can help determine your BMI and help you achieve or maintain a healthy weight. Get regular exercise Get regular exercise. This is one of the most important things you can do for your health. Most adults should: Exercise for at least 150 minutes each week. The exercise should increase your heart rate and make you sweat (moderate-intensity exercise). Do strengthening exercises at least twice a week. This is in addition to the moderate-intensity exercise. Spend less time sitting. Even light physical activity can be beneficial. Watch cholesterol and blood lipids Have your blood tested for lipids and cholesterol at 51 years of age, then have this test every 5 years. You may need to have your cholesterol levels checked more often if: Your lipid or cholesterol levels are high. You are older than 51 years of age. You are at high risk for heart disease. What should I know about cancer screening? Many types of cancers can be detected early and may often be  prevented. Depending on your health history and family history, you may need to have cancer screening at various ages. This may include screening for: Colorectal cancer. Prostate cancer. Skin cancer. Lung cancer. What should I know about heart disease, diabetes, and high blood pressure? Blood pressure and heart disease High blood pressure causes heart disease and increases the risk of stroke. This is more likely to develop in people who have high blood pressure readings or are overweight. Talk with your health care provider about your target blood pressure readings. Have your blood pressure checked: Every 3-5 years if you are 23-31 years of age. Every year if you are 68 years old or older. If you are between the ages of 57 and 42 and are a current or former smoker, ask your health care provider if you should have a one-time screening for abdominal aortic aneurysm (AAA). Diabetes Have regular diabetes screenings. This checks your fasting blood sugar level. Have the screening done: Once every three years after age 53 if you are at a normal weight and have a low risk for diabetes. More often and at a younger age if you are overweight or have a high risk for diabetes. What should I know about preventing infection? Hepatitis B If you have a higher risk for hepatitis B, you should be screened for this virus. Talk with your health care provider to find out if you are at risk for hepatitis B infection. Hepatitis C Blood testing is recommended for: Everyone born from [birth year]  through 1965. Anyone with known risk factors for hepatitis C. Sexually transmitted infections (STIs) You should be screened each year for STIs, including gonorrhea and chlamydia, if: You are sexually active and are younger than 51 years of age. You are older than 50 years of age and your health care provider tells you that you are at risk for this type of infection. Your sexual activity has changed since you were last screened,  and you are at increased risk for chlamydia or gonorrhea. Ask your health care provider if you are at risk. Ask your health care provider about whether you are at high risk for HIV. Your health care provider may recommend a prescription medicine to help prevent HIV infection. If you choose to take medicine to prevent HIV, you should first get tested for HIV. You should then be tested every 3 months for as long as you are taking the medicine. Follow these instructions at home: Alcohol use Do not drink alcohol if your health care provider tells you not to drink. If you drink alcohol: Limit how much you have to 0-2 drinks a day. Know how much alcohol is in your drink. In the [place] one drink equals one 12 oz bottle of beer (355 mL), one 5 oz glass of wine (148 mL), or one 1 oz glass of hard liquor (44 mL). Lifestyle Do not use any products that contain nicotine or tobacco. These products include cigarettes, chewing tobacco, and vaping devices, such as e-cigarettes. If you need help quitting, ask your health care provider. Do not use street drugs. Do not share needles. Ask your health care provider for help if you need support or information about quitting drugs. General instructions Schedule regular health, dental, and eye exams. Stay current with your vaccines. Tell your health care provider if: You often feel depressed. You have ever been abused or do not feel safe at home. Summary Adopting a healthy lifestyle and getting preventive care are important in promoting health and wellness. Follow your health care provider's instructions about healthy diet, exercising, and getting tested or screened for diseases. Follow your health care provider's instructions on monitoring your cholesterol and blood pressure. This information is not intended to replace advice given to you by your health care provider. Make sure you discuss any questions you have with your health care provider. Document Revised:  07/18/2020 Document Reviewed: 07/18/2020 Elsevier Patient Education  2024 [employer].

## 2023-11-05 NOTE — Progress Notes (Unsigned)
 Subjective:    Patient ID: Danny Zimmerman, male    DOB: 01-29-1971, 53 y.o.   MRN: [id number]     HPI Danny Zimmerman is here for a physical exam and his chronic medical problems.   Has a sensation of something in his throat.  That gives him a sensation of difficulty swallowing.  Using allergy nasal spray.       Medications and allergies reviewed with patient and updated if appropriate.  Current Outpatient Medications on File Prior to Visit  Medication Sig Dispense Refill   Acetaminophen  (TYLENOL  8 HOUR PO) Take by mouth as needed.     fluticasone  (FLONASE ) 50 MCG/ACT nasal spray Place 2 sprays into both nostrils 2 (two) times daily. 16 g 6   ibuprofen  (ADVIL ) 600 MG tablet Take 1 tablet (600 mg total) by mouth every 6 (six) hours as needed. 30 tablet 0   omega-3 acid ethyl esters (LOVAZA ) 1 g capsule Take 2 capsules (2 g total) by mouth 2 (two) times daily. E78.1 120 capsule 5   No current facility-administered medications on file prior to visit.    Review of Systems  Constitutional:  Negative for fever.  HENT:  Positive for tinnitus (left ear - intermittent).   Eyes:  Negative for visual disturbance.  Respiratory:  Negative for cough, shortness of breath and wheezing.   Cardiovascular:  Positive for chest pain (occ). Negative for palpitations and leg swelling.  Gastrointestinal:  Negative for abdominal pain, blood in stool, constipation, diarrhea and nausea.       Burping, globus sensation, sometimes reflux - maybe once a week, No gerd  Genitourinary:  Negative for difficulty urinating, dysuria and hematuria.  Musculoskeletal:  Negative for arthralgias and back pain.  Skin:  Negative for rash.  Neurological:  Positive for headaches (occ). Negative for light-headedness.  Psychiatric/Behavioral:  Negative for dysphoric mood and sleep disturbance. The patient is not nervous/anxious.        Objective:   Vitals:   11/06/23 1300  BP: 120/74  Pulse: 71  Temp: 98.1 F (36.7 C)   SpO2: 98%   Filed Weights   11/06/23 1300  Weight: 184 lb (83.5 kg)   Body mass index is 30.62 kg/m.  BP Readings from Last 3 Encounters:  11/06/23 120/74  09/09/23 118/76  08/21/23 120/78    Wt Readings from Last 3 Encounters:  11/06/23 184 lb (83.5 kg)  09/09/23 184 lb (83.5 kg)  08/21/23 184 lb (83.5 kg)      Physical Exam Constitutional: He appears well-developed and well-nourished. No distress.  HENT:  Head: Normocephalic and atraumatic.  Right Ear: External ear normal.  Left Ear: External ear normal.  Normal ear canals and TM b/l  Mouth/Throat: Oropharynx is clear and moist. Eyes: Conjunctivae and EOM are normal.  Neck: Neck supple. No tracheal deviation present. No thyromegaly present.  No carotid bruit  Cardiovascular: Normal rate, regular rhythm, normal heart sounds and intact distal pulses.   No murmur heard.  No lower extremity edema. Pulmonary/Chest: Effort normal and breath sounds normal. No respiratory distress. He has no wheezes. He has no rales.  Abdominal: Soft. He exhibits no distension. There is no tenderness.  Genitourinary: deferred  Lymphadenopathy:   He has no cervical adenopathy.  Skin: Skin is warm and dry. He is not diaphoretic.  Psychiatric: He has a normal mood and affect. His behavior is normal.         Assessment & Plan:   Physical exam: Screening blood work  ordered  Exercise   regular - knows he needs to do more Weight  obese - encouraged weight loss Substance abuse   none   Reviewed recommended immunizations.   Health Maintenance  Topic Date Due   Hepatitis B Vaccines 19-59 Average Risk (1 of 3 - 19+ 3-dose series) Never done   Pneumococcal Vaccine: 50+ Years (1 of 1 - PCV) Never done   Zoster Vaccines- Shingrix (1 of 2) Never done   DTaP/Tdap/Td (2 - Td or Tdap) 01/31/2028   Colonoscopy  07/13/2028   Hepatitis C Screening  Completed   HIV Screening  Completed   HPV VACCINES  Aged Out   Meningococcal B Vaccine  Aged  Out   INFLUENZA VACCINE  Discontinued   COVID-19 Vaccine  Discontinued     See Problem List for Assessment and Plan of chronic medical problems.

## 2023-11-06 ENCOUNTER — Ambulatory Visit: Payer: Self-pay | Admitting: Internal Medicine

## 2023-11-06 ENCOUNTER — Ambulatory Visit (INDEPENDENT_AMBULATORY_CARE_PROVIDER_SITE_OTHER): Payer: 59 | Admitting: Internal Medicine

## 2023-11-06 VITALS — BP 120/74 | HR 71 | Temp 98.1°F | Ht 65.0 in | Wt 184.0 lb

## 2023-11-06 DIAGNOSIS — Z0001 Encounter for general adult medical examination with abnormal findings: Secondary | ICD-10-CM | POA: Diagnosis not present

## 2023-11-06 DIAGNOSIS — E781 Pure hyperglyceridemia: Secondary | ICD-10-CM

## 2023-11-06 DIAGNOSIS — K219 Gastro-esophageal reflux disease without esophagitis: Secondary | ICD-10-CM

## 2023-11-06 DIAGNOSIS — R7303 Prediabetes: Secondary | ICD-10-CM

## 2023-11-06 DIAGNOSIS — Z0000 Encounter for general adult medical examination without abnormal findings: Secondary | ICD-10-CM

## 2023-11-06 DIAGNOSIS — E66811 Obesity, class 1: Secondary | ICD-10-CM

## 2023-11-06 LAB — LIPID PANEL
Cholesterol: 187 mg/dL (ref 0–200)
HDL: 45.4 mg/dL (ref >39.00)
LDL Cholesterol: 65 mg/dL (ref 0–99)
NonHDL: 141.34
Total CHOL/HDL Ratio: 4
Triglycerides: 382 mg/dL — ABNORMAL HIGH (ref 0.0–149.0)
VLDL: 76.4 mg/dL — ABNORMAL HIGH (ref 0.0–40.0)

## 2023-11-06 LAB — CBC WITH DIFFERENTIAL/PLATELET
Basophils Absolute: 0 K/uL (ref 0.0–0.1)
Basophils Relative: 0.4 % (ref 0.0–3.0)
Eosinophils Absolute: 0.1 K/uL (ref 0.0–0.7)
Eosinophils Relative: 0.8 % (ref 0.0–5.0)
HCT: 47.4 % (ref 39.0–52.0)
Hemoglobin: 15.7 g/dL (ref 13.0–17.0)
Lymphocytes Relative: 39 % (ref 12.0–46.0)
Lymphs Abs: 2.5 K/uL (ref 0.7–4.0)
MCHC: 33.1 g/dL (ref 30.0–36.0)
MCV: 84.8 fl (ref 78.0–100.0)
Monocytes Absolute: 0.4 K/uL (ref 0.1–1.0)
Monocytes Relative: 6.1 % (ref 3.0–12.0)
Neutro Abs: 3.5 K/uL (ref 1.4–7.7)
Neutrophils Relative %: 53.7 % (ref 43.0–77.0)
Platelets: 295 K/uL (ref 150.0–400.0)
RBC: 5.58 Mil/uL (ref 4.22–5.81)
RDW: 13.1 % (ref 11.5–15.5)
WBC: 6.4 K/uL (ref 4.0–10.5)

## 2023-11-06 LAB — COMPREHENSIVE METABOLIC PANEL WITH GFR
ALT: 28 U/L (ref 0–53)
AST: 23 U/L (ref 0–37)
Albumin: 4.7 g/dL (ref 3.5–5.2)
Alkaline Phosphatase: 75 U/L (ref 39–117)
BUN: 8 mg/dL (ref 6–23)
CO2: 25 meq/L (ref 19–32)
Calcium: 9.3 mg/dL (ref 8.4–10.5)
Chloride: 100 meq/L (ref 96–112)
Creatinine, Ser: 0.72 mg/dL (ref 0.40–1.50)
GFR: 106.36 mL/min (ref >60.00)
Glucose, Bld: 92 mg/dL (ref 70–99)
Potassium: 3.5 meq/L (ref 3.5–5.1)
Sodium: 136 meq/L (ref 135–145)
Total Bilirubin: 0.9 mg/dL (ref 0.2–1.2)
Total Protein: 8.2 g/dL (ref 6.0–8.3)

## 2023-11-06 LAB — HEMOGLOBIN A1C: Hgb A1c MFr Bld: 6.2 % (ref 4.6–6.5)

## 2023-11-06 LAB — TSH: TSH: 0.8 u[IU]/mL (ref 0.35–5.50)

## 2023-11-06 MED ORDER — FAMOTIDINE 40 MG PO TABS: 40.0000 mg | ORAL_TABLET | Freq: Every day | ORAL | 5 refills | Status: DC

## 2023-11-06 NOTE — Assessment & Plan Note (Signed)
 Chronic Lab Results  Component Value Date   HGBA1C 6.0 09/09/2023   Check a1c Low sugar / carb diet Stressed regular exercise

## 2023-11-06 NOTE — Assessment & Plan Note (Signed)
 Chronic Stressed diet changes Continue and increase regular exercise Work on weight loss Continue lovaza  2g bid Lipids, cmp, cbc, tsh

## 2023-11-06 NOTE — Assessment & Plan Note (Signed)
 Chronic Has reflux 2 times a week, having globus sensation Symptoms consistent with GERD Start Pepcid  40 mg daily for 2-3 weeks and then try to stop and use as needed as long as GERD is controlled Revise diet Weight loss

## 2023-11-06 NOTE — Assessment & Plan Note (Signed)
 Discussed importance of weight loss Stressed regular exercise - will increase exercise

## 2024-03-18 DIAGNOSIS — R1310 Dysphagia, unspecified: Secondary | ICD-10-CM | POA: Insufficient documentation

## 2024-03-18 NOTE — Progress Notes (Signed)
 "   Subjective:    Patient ID: Danny Zimmerman, male    DOB: 1970/05/01, 54 y.o.   MRN: [id number]      HPI Teren is here for  Chief Complaint  Patient presents with   Trouble swallowing    Trouble swallowing, ringing in ears still and not sleeping    Dysphagia still there.  Tinnitus correlates with swallowing.  The tinnitus is bad - causes difficultly sleeping.  2018 - h/o H pylori gastritis - treated.   Exercising irregularly.     Discussed the use of AI scribe software for clinical note transcription with the patient, who gave verbal consent to proceed.  History of Present Illness Danny Zimmerman is a 54 year old male who presents with difficulty swallowing and tinnitus.  He experiences ongoing difficulty swallowing, which coincides with episodes of high-pitched tinnitus. The sensation of food potentially getting stuck, though not painful, has led to reduced food intake and some weight loss. A similar issue was evaluated in 2018 with an endoscopy, which showed a normal esophagus, but dilation was performed. He was treated for H. pylori at that time.  The tinnitus is persistent, described as a high-pitched ringing that disrupts his sleep, causing fatigue. It varies with head position, becoming more noticeable when lying in certain positions.    He experiences intermittent sharp chest pain sometimes associated with movement. He reports intermittent sharp chest pain that sometimes improves with movement.  He has a history of high triglycerides, previously recorded at 1100, but more recently at 382 in August. He has been working on lifestyle modifications to manage this and has previously taken Lovaza , an omega-3 supplement, but stopped it some time ago.  He mentions shoulder pain reminiscent of a past rotator cuff injury, which he is cautious about aggravating with exercise. He plans to start exercising more regularly with his wife.  He is not currently taking any medications for  heartburn or stomach issues, although he has experienced some burping and occasional mild heartburn. He previously took Pepcid  (famotidine ) and has not been using Flonase  recently.     Medications and allergies reviewed with patient and updated if appropriate.  Medications Ordered Prior to Encounter[1]  Review of Systems  HENT:  Positive for tinnitus (L) and trouble swallowing (when he has tinnitus). Negative for postnasal drip, rhinorrhea, sore throat and voice change.        Left ear clogs on occasion  Gastrointestinal:        Burping at times,  a little reflux - rare       Objective:   Vitals:   03/19/24 0932  BP: 124/72  Pulse: 73  Temp: 98.2 F (36.8 C)  SpO2: 98%   BP Readings from Last 3 Encounters:  03/19/24 124/72  11/06/23 120/74  09/09/23 118/76   Wt Readings from Last 3 Encounters:  03/19/24 174 lb (78.9 kg)  11/06/23 184 lb (83.5 kg)  09/09/23 184 lb (83.5 kg)   Body mass index is 28.96 kg/m.    Physical Exam Constitutional:      General: He is not in acute distress.    Appearance: Normal appearance. He is not ill-appearing.  HENT:     Head: Normocephalic and atraumatic.  Eyes:     Conjunctiva/sclera: Conjunctivae normal.  Cardiovascular:     Rate and Rhythm: Normal rate and regular rhythm.     Heart sounds: Normal heart sounds.  Pulmonary:     Effort: Pulmonary effort is normal. No respiratory distress.  Breath sounds: Normal breath sounds. No wheezing or rales.  Chest:     Chest wall: Tenderness (Left chest) present.  Musculoskeletal:     Right lower leg: No edema.     Left lower leg: No edema.  Skin:    General: Skin is warm and dry.     Findings: No rash.  Neurological:     Mental Status: He is alert. Mental status is at baseline.  Psychiatric:        Mood and Affect: Mood normal.            Assessment & Plan:    See Problem List for Assessment and Plan of chronic medical problems.          [1]  Current  Outpatient Medications on File Prior to Visit  Medication Sig Dispense Refill   famotidine  (PEPCID ) 40 MG tablet Take 1 tablet (40 mg total) by mouth daily. After 2-3 weeks take as needed 30 tablet 5   fluticasone  (FLONASE ) 50 MCG/ACT nasal spray Place 2 sprays into both nostrils 2 (two) times daily. 16 g 6   omega-3 acid ethyl esters (LOVAZA ) 1 g capsule Take 2 capsules (2 g total) by mouth 2 (two) times daily. E78.1 120 capsule 5   No current facility-administered medications on file prior to visit.   "

## 2024-03-19 ENCOUNTER — Encounter: Payer: Self-pay | Admitting: Internal Medicine

## 2024-03-19 ENCOUNTER — Ambulatory Visit: Payer: Self-pay | Admitting: Internal Medicine

## 2024-03-19 VITALS — BP 124/72 | HR 73 | Temp 98.2°F | Ht 65.0 in | Wt 174.0 lb

## 2024-03-19 DIAGNOSIS — R1310 Dysphagia, unspecified: Secondary | ICD-10-CM | POA: Diagnosis not present

## 2024-03-19 DIAGNOSIS — K219 Gastro-esophageal reflux disease without esophagitis: Secondary | ICD-10-CM

## 2024-03-19 DIAGNOSIS — R0789 Other chest pain: Secondary | ICD-10-CM | POA: Diagnosis not present

## 2024-03-19 DIAGNOSIS — H9312 Tinnitus, left ear: Secondary | ICD-10-CM

## 2024-03-19 DIAGNOSIS — E781 Pure hyperglyceridemia: Secondary | ICD-10-CM

## 2024-03-19 LAB — LIPID PANEL
Cholesterol: 217 mg/dL — ABNORMAL HIGH (ref 28–200)
HDL: 50.3 mg/dL
LDL Cholesterol: 114 mg/dL — ABNORMAL HIGH (ref 10–99)
NonHDL: 166.8
Total CHOL/HDL Ratio: 4
Triglycerides: 263 mg/dL — ABNORMAL HIGH (ref 10.0–149.0)
VLDL: 52.6 mg/dL — ABNORMAL HIGH (ref 0.0–40.0)

## 2024-03-19 MED ORDER — FAMOTIDINE 40 MG PO TABS: 40.0000 mg | ORAL_TABLET | Freq: Every day | ORAL | 1 refills | Status: AC

## 2024-03-19 NOTE — Assessment & Plan Note (Signed)
 Chronic Was on Lovaza  for a while, but has not taken it for a while Has made improvements in diet and has lost weight Not currently exercising regularly but plans on starting Check lipids today Consider restarting Lovaza  if triglycerides are still elevated

## 2024-03-19 NOTE — Patient Instructions (Addendum)
" ° ° °  Have blood work done today   Use flonase  prior to flying.     Medications changes include :   restart pepcid  40 mg daily     "

## 2024-03-19 NOTE — Assessment & Plan Note (Addendum)
 Subacute Has been having some difficulty swallowing in his throat area for a while occasional GERD, burping sensation Not on any medication for reflux History of H. pylori gastritis in 2018, which was treated S/p esophageal dilation in 2018 although no stricture was seen ?  Related to reflux-start Pepcid  40 mg daily If there is no improvement will need to see ENT or GI

## 2024-03-19 NOTE — Assessment & Plan Note (Signed)
 Chronic Intermittent Rib and chest x-ray several months ago normal Worse with certain movements, sometimes pain with palpation Pain is musculoskeletal in nature Symptomatic treatment

## 2024-03-19 NOTE — Assessment & Plan Note (Signed)
 Chronic Started with Bell's palsy Intermittent At night seems to be related to certain positions Sometimes seems to be related to dysphagia-?  Having some laryngitis which could be associated with tinnitus Continues to work on distracting methods and trying to ignore this It is affecting his sleep Advised listening to music or podcast that year to see if that helps distract from the tinnitus

## 2024-03-19 NOTE — Assessment & Plan Note (Addendum)
 Chronic Sounds mild and intermittent, but he is also having dysphagia so he may be having more often than he thinks Start famotidine  40 mg daily-advised him to pay attention to how much this helps with the swallowing May need to see ENT or GI if no improvement in dysphagia

## 2024-03-21 ENCOUNTER — Ambulatory Visit: Payer: Self-pay | Admitting: Internal Medicine

## 2024-03-22 MED ORDER — OMEGA-3-ACID ETHYL ESTERS 1 G PO CAPS: 2.0000 g | ORAL_CAPSULE | Freq: Two times a day (BID) | ORAL | 3 refills | Status: AC

## 2024-05-08 ENCOUNTER — Ambulatory Visit: Admitting: Internal Medicine
